# Patient Record
Sex: Female | Born: 1945 | Race: Black or African American | Hispanic: No | Smoking: Never smoker
Health system: Southern US, Community
[De-identification: ages and names within clinical notes are randomized; demographics above are authoritative.]

## PROBLEM LIST (undated history)

## (undated) DIAGNOSIS — M4800 Spinal stenosis, site unspecified: Secondary | ICD-10-CM

## (undated) DIAGNOSIS — Z973 Presence of spectacles and contact lenses: Secondary | ICD-10-CM

## (undated) DIAGNOSIS — T7840XA Allergy, unspecified, initial encounter: Secondary | ICD-10-CM

## (undated) DIAGNOSIS — K219 Gastro-esophageal reflux disease without esophagitis: Secondary | ICD-10-CM

## (undated) DIAGNOSIS — H269 Unspecified cataract: Secondary | ICD-10-CM

## (undated) DIAGNOSIS — Z972 Presence of dental prosthetic device (complete) (partial): Secondary | ICD-10-CM

## (undated) DIAGNOSIS — I10 Essential (primary) hypertension: Secondary | ICD-10-CM

## (undated) DIAGNOSIS — R7303 Prediabetes: Secondary | ICD-10-CM

## (undated) DIAGNOSIS — M1990 Unspecified osteoarthritis, unspecified site: Secondary | ICD-10-CM

## (undated) HISTORY — PX: CHOLECYSTECTOMY: SHX55

## (undated) HISTORY — DX: Essential (primary) hypertension: I10

## (undated) HISTORY — PX: SCHLEROTHERAPY: SHX5440

## (undated) HISTORY — DX: Gastro-esophageal reflux disease without esophagitis: K21.9

## (undated) HISTORY — DX: Unspecified cataract: H26.9

## (undated) HISTORY — DX: Prediabetes: R73.03

## (undated) HISTORY — DX: Allergy, unspecified, initial encounter: T78.40XA

## (undated) HISTORY — DX: Unspecified osteoarthritis, unspecified site: M19.90

## (undated) HISTORY — PX: ABDOMINAL HYSTERECTOMY: SHX81

---

## 1984-05-11 HISTORY — PX: BUNIONECTOMY: SHX129

## 1998-08-20 ENCOUNTER — Other Ambulatory Visit: Admission: RE | Admit: 1998-08-20 | Discharge: 1998-08-20 | Payer: Self-pay | Admitting: Internal Medicine

## 1999-02-17 ENCOUNTER — Other Ambulatory Visit: Admission: RE | Admit: 1999-02-17 | Discharge: 1999-02-17 | Payer: Self-pay | Admitting: Internal Medicine

## 1999-11-13 ENCOUNTER — Other Ambulatory Visit: Admission: RE | Admit: 1999-11-13 | Discharge: 1999-11-13 | Payer: Self-pay | Admitting: Family Medicine

## 2003-04-19 ENCOUNTER — Other Ambulatory Visit: Admission: RE | Admit: 2003-04-19 | Discharge: 2003-04-19 | Payer: Self-pay | Admitting: Obstetrics and Gynecology

## 2004-09-02 ENCOUNTER — Other Ambulatory Visit: Admission: RE | Admit: 2004-09-02 | Discharge: 2004-09-02 | Payer: Self-pay | Admitting: Obstetrics and Gynecology

## 2006-11-11 ENCOUNTER — Encounter: Admission: RE | Admit: 2006-11-11 | Discharge: 2006-11-11 | Payer: Self-pay | Admitting: Gastroenterology

## 2007-09-09 LAB — CONVERTED CEMR LAB: Pap Smear: NORMAL

## 2007-09-27 ENCOUNTER — Encounter: Admission: RE | Admit: 2007-09-27 | Discharge: 2007-09-27 | Payer: Self-pay | Admitting: Obstetrics and Gynecology

## 2007-10-14 ENCOUNTER — Ambulatory Visit: Payer: Self-pay | Admitting: Internal Medicine

## 2007-11-18 ENCOUNTER — Ambulatory Visit: Payer: Self-pay | Admitting: Internal Medicine

## 2007-11-18 LAB — CONVERTED CEMR LAB
ALT: 21 units/L (ref 0–35)
Basophils Absolute: 0 10*3/uL (ref 0.0–0.1)
Basophils Relative: 0.4 % (ref 0.0–1.0)
CO2: 31 meq/L (ref 19–32)
Calcium: 10 mg/dL (ref 8.4–10.5)
Cholesterol: 200 mg/dL (ref 0–200)
Creatinine, Ser: 0.6 mg/dL (ref 0.4–1.2)
Eosinophils Absolute: 0.1 10*3/uL (ref 0.0–0.7)
GFR calc Af Amer: 129 mL/min
GFR calc non Af Amer: 107 mL/min
HCT: 40.2 % (ref 36.0–46.0)
Hemoglobin: 13.2 g/dL (ref 12.0–15.0)
MCHC: 32.9 g/dL (ref 30.0–36.0)
MCV: 82.5 fL (ref 78.0–100.0)
Monocytes Absolute: 0.3 10*3/uL (ref 0.1–1.0)
Neutro Abs: 3.1 10*3/uL (ref 1.4–7.7)
RBC: 4.88 M/uL (ref 3.87–5.11)
TSH: 1.2 microintl units/mL (ref 0.35–5.50)
Total Bilirubin: 0.5 mg/dL (ref 0.3–1.2)
Triglycerides: 101 mg/dL (ref 0–149)

## 2007-11-25 ENCOUNTER — Ambulatory Visit: Payer: Self-pay | Admitting: Internal Medicine

## 2009-09-05 ENCOUNTER — Emergency Department (HOSPITAL_COMMUNITY): Admission: EM | Admit: 2009-09-05 | Discharge: 2009-09-05 | Payer: Self-pay | Admitting: Family Medicine

## 2010-03-28 ENCOUNTER — Encounter: Admission: RE | Admit: 2010-03-28 | Discharge: 2010-03-28 | Payer: Self-pay | Admitting: Family Medicine

## 2010-06-01 ENCOUNTER — Encounter: Payer: Self-pay | Admitting: Obstetrics and Gynecology

## 2010-06-02 ENCOUNTER — Encounter: Payer: Self-pay | Admitting: Internal Medicine

## 2010-07-29 LAB — POCT URINALYSIS DIP (DEVICE)
Ketones, ur: NEGATIVE mg/dL
Protein, ur: NEGATIVE mg/dL
Urobilinogen, UA: 0.2 mg/dL (ref 0.0–1.0)

## 2010-07-29 LAB — GC/CHLAMYDIA PROBE AMP, GENITAL: GC Probe Amp, Genital: NEGATIVE

## 2010-07-29 LAB — WET PREP, GENITAL: Trich, Wet Prep: NONE SEEN

## 2011-03-27 ENCOUNTER — Other Ambulatory Visit: Payer: Self-pay | Admitting: Family Medicine

## 2011-03-27 DIAGNOSIS — Z1231 Encounter for screening mammogram for malignant neoplasm of breast: Secondary | ICD-10-CM

## 2011-04-06 ENCOUNTER — Ambulatory Visit: Payer: Self-pay

## 2011-05-07 ENCOUNTER — Encounter (INDEPENDENT_AMBULATORY_CARE_PROVIDER_SITE_OTHER): Payer: Medicare Other | Admitting: Family Medicine

## 2011-05-07 DIAGNOSIS — I8390 Asymptomatic varicose veins of unspecified lower extremity: Secondary | ICD-10-CM

## 2011-05-07 DIAGNOSIS — Z0000 Encounter for general adult medical examination without abnormal findings: Secondary | ICD-10-CM

## 2011-05-07 DIAGNOSIS — I10 Essential (primary) hypertension: Secondary | ICD-10-CM

## 2011-06-16 ENCOUNTER — Ambulatory Visit
Admission: RE | Admit: 2011-06-16 | Discharge: 2011-06-16 | Disposition: A | Discharge status: Home / Self Care | Payer: Medicare Other | Source: Ambulatory Visit | Attending: Family Medicine | Admitting: Family Medicine

## 2011-06-16 DIAGNOSIS — Z1231 Encounter for screening mammogram for malignant neoplasm of breast: Secondary | ICD-10-CM

## 2011-07-23 ENCOUNTER — Encounter: Payer: Self-pay | Admitting: Gastroenterology

## 2011-10-22 ENCOUNTER — Other Ambulatory Visit: Payer: Self-pay | Admitting: Family Medicine

## 2011-11-24 ENCOUNTER — Other Ambulatory Visit: Payer: Self-pay | Admitting: Physician Assistant

## 2012-01-04 ENCOUNTER — Other Ambulatory Visit: Payer: Self-pay | Admitting: Physician Assistant

## 2012-02-11 ENCOUNTER — Other Ambulatory Visit: Payer: Self-pay | Admitting: Physician Assistant

## 2012-02-11 NOTE — Telephone Encounter (Signed)
Chart pulled to PA pool at nurse station [phone]

## 2012-05-19 ENCOUNTER — Encounter: Payer: Self-pay | Admitting: Family Medicine

## 2012-05-19 ENCOUNTER — Ambulatory Visit (INDEPENDENT_AMBULATORY_CARE_PROVIDER_SITE_OTHER): Payer: Medicare Other | Admitting: Family Medicine

## 2012-05-19 VITALS — BP 160/88 | HR 74 | Temp 97.7°F | Resp 16 | Ht 64.25 in | Wt 204.6 lb

## 2012-05-19 DIAGNOSIS — Z0000 Encounter for general adult medical examination without abnormal findings: Secondary | ICD-10-CM

## 2012-05-19 DIAGNOSIS — M1990 Unspecified osteoarthritis, unspecified site: Secondary | ICD-10-CM | POA: Insufficient documentation

## 2012-05-19 DIAGNOSIS — I10 Essential (primary) hypertension: Secondary | ICD-10-CM

## 2012-05-19 DIAGNOSIS — Z130 Encounter for screening for diseases of the blood and blood-forming organs and certain disorders involving the immune mechanism: Secondary | ICD-10-CM

## 2012-05-19 DIAGNOSIS — Z1329 Encounter for screening for other suspected endocrine disorder: Secondary | ICD-10-CM

## 2012-05-19 DIAGNOSIS — M6530 Trigger finger, unspecified finger: Secondary | ICD-10-CM

## 2012-05-19 DIAGNOSIS — I868 Varicose veins of other specified sites: Secondary | ICD-10-CM

## 2012-05-19 DIAGNOSIS — E559 Vitamin D deficiency, unspecified: Secondary | ICD-10-CM

## 2012-05-19 LAB — POCT URINALYSIS DIPSTICK
Bilirubin, UA: NEGATIVE
Blood, UA: NEGATIVE
Glucose, UA: NEGATIVE
Ketones, UA: NEGATIVE
Leukocytes, UA: NEGATIVE
Nitrite, UA: NEGATIVE
Protein, UA: NEGATIVE
Spec Grav, UA: >=1.03
Urobilinogen, UA: 0.2
pH, UA: 5.5

## 2012-05-19 LAB — CBC WITH DIFFERENTIAL/PLATELET
Basophils Absolute: 0 10*3/uL (ref 0.0–0.1)
Basophils Relative: 0 % (ref 0–1)
Eosinophils Absolute: 0.1 10*3/uL (ref 0.0–0.7)
Eosinophils Relative: 1 % (ref 0–5)
HCT: 42.4 % (ref 36.0–46.0)
Hemoglobin: 14.1 g/dL (ref 12.0–15.0)
Lymphocytes Relative: 33 % (ref 12–46)
Lymphs Abs: 2.2 10*3/uL (ref 0.7–4.0)
MCH: 26.4 pg (ref 26.0–34.0)
MCHC: 33.3 g/dL (ref 30.0–36.0)
MCV: 79.3 fL (ref 78.0–100.0)
Monocytes Absolute: 0.3 10*3/uL (ref 0.1–1.0)
Monocytes Relative: 5 % (ref 3–12)
Neutro Abs: 4 10*3/uL (ref 1.7–7.7)
Neutrophils Relative %: 61 % (ref 43–77)
Platelets: 234 10*3/uL (ref 150–400)
RBC: 5.35 MIL/uL — ABNORMAL HIGH (ref 3.87–5.11)
RDW: 14.6 % (ref 11.5–15.5)
WBC: 6.6 10*3/uL (ref 4.0–10.5)

## 2012-05-19 LAB — COMPREHENSIVE METABOLIC PANEL
ALT: 18 U/L (ref 0–35)
AST: 17 U/L (ref 0–37)
Albumin: 4.3 g/dL (ref 3.5–5.2)
Alkaline Phosphatase: 82 U/L (ref 39–117)
BUN: 14 mg/dL (ref 6–23)
CO2: 28 mEq/L (ref 19–32)
Calcium: 11.4 mg/dL — ABNORMAL HIGH (ref 8.4–10.5)
Chloride: 101 mEq/L (ref 96–112)
Creat: 0.8 mg/dL (ref 0.50–1.10)
Glucose, Bld: 96 mg/dL (ref 70–99)
Potassium: 4.5 mEq/L (ref 3.5–5.3)
Sodium: 134 mEq/L — ABNORMAL LOW (ref 135–145)
Total Bilirubin: 0.5 mg/dL (ref 0.3–1.2)
Total Protein: 7.3 g/dL (ref 6.0–8.3)

## 2012-05-19 LAB — LIPID PANEL
Cholesterol: 214 mg/dL — ABNORMAL HIGH (ref 0–200)
HDL: 43 mg/dL (ref >39)
LDL Cholesterol: 150 mg/dL — ABNORMAL HIGH (ref 0–99)
Total CHOL/HDL Ratio: 5 Ratio
Triglycerides: 105 mg/dL (ref <150)
VLDL: 21 mg/dL (ref 0–40)

## 2012-05-19 LAB — VITAMIN D 25 HYDROXY (VIT D DEFICIENCY, FRACTURES): Vit D, 25-Hydroxy: 23 ng/mL — ABNORMAL LOW (ref 30–89)

## 2012-05-19 LAB — TSH: TSH: 1.592 u[IU]/mL (ref 0.350–4.500)

## 2012-05-19 MED ORDER — HYDROCHLOROTHIAZIDE 12.5 MG PO CAPS: 12.5000 mg | ORAL_CAPSULE | Freq: Every day | ORAL | Status: DC

## 2012-05-19 MED ORDER — DICLOFENAC SODIUM 1 % TD GEL: 4.0000 g | Freq: Four times a day (QID) | TRANSDERMAL | Status: DC

## 2012-05-19 NOTE — Patient Instructions (Signed)

## 2012-05-19 NOTE — Progress Notes (Signed)
Patient ID: Yvonne Mahoney MRN: [id number], DOB: May 20, 1945, 67 y.o. Date of Encounter: 05/19/2012, 9:06 AM  Primary Physician: Judie Petit, MD  Chief Complaint: Physical (CPE)  HPI: 67 y.o. y/o female with history of noted below here for CPE.  Doing well. Retired Tree surgeon reserves.  Divorced.  Son lives in [place], parents deceased Patient walks 1.7 to over 2 miles daily.  Never smoked or drank.  Colonoscopy 10/02/10 Eye exam yearly:  Cataracts reported Pneumonia vaccine:  10/01/01,  Tetanus:  01-Oct-2008,  Shingles vaccine prescribed in 10/01/08 but patient could not afford Patient has no record of having had chicken pox:  Parents and siblings are all dead.  LMP: years ago Pap: 2008-10-01 MMG: 2014 Review of Systems: Consitutional: No fever, chills, fatigue, night sweats, lymphadenopathy, or weight changes. Eyes: No visual changes, eye redness, or discharge. ENT/Mouth: Ears: No otalgia, tinnitus, hearing loss, discharge. Nose: No congestion, rhinorrhea, sinus pain, or epistaxis. Throat: No sore throat, post nasal drip, or teeth pain. Cardiovascular: No CP, palpitations, diaphoresis, DOE, edema, orthopnea, PND. Respiratory: No cough, hemoptysis, SOB, or wheezing. Gastrointestinal: No anorexia, dysphagia, reflux, pain, nausea, vomiting, hematemesis, diarrhea, constipation, BRBPR, or melena. Breast: No discharge, pain, swelling, or mass. Genitourinary: No dysuria, frequency, urgency, hematuria, incontinence, nocturia, amenorrhea, vaginal discharge, pruritis, burning, abnormal bleeding, or pain. Musculoskeletal: No decreased ROM, myalgias, stiffness, joint swelling, or weakness. Skin: No rash, erythema, lesion changes, pain, warmth, jaundice, or pruritis. Neurological: No headache, dizziness, syncope, seizures, tremors, memory loss, coordination problems, or paresthesias. Psychological: No anxiety, depression, hallucinations, SI/HI. Endocrine: No fatigue, polydipsia, polyphagia,  polyuria, or known diabetes. All other systems were reviewed and are otherwise negative.  Past Medical History  Diagnosis Date  . Allergy   . Hypertension   . Arthritis   . Cataract      Past Surgical History  Procedure Date  . Cholecystectomy   . Abdominal hysterectomy   . Bunionectomy     Home Meds:  Prior to Admission medications   Medication Sig Start Date End Date Taking? Authorizing Provider  Vitamin D, Ergocalciferol, (DRISDOL) 50000 UNITS CAPS TAKE ONE CAPSULE BY MOUTH ONCE A WEEK --  **NEEDS  OFFICE  VISIT  FOR  MORE  REFILLS** 02/11/12  Yes Sondra Barges, PA-C    Allergies:  Allergies  Allergen Reactions  . Penicillins     REACTION: allergy/hand swelling    History   Social History  . Marital Status: Divorced    Spouse Name: N/A    Number of Children: N/A  . Years of Education: N/A   Occupational History  . Not on file.   Social History Main Topics  . Smoking status: Never Smoker   . Smokeless tobacco: Not on file  . Alcohol Use: No  . Drug Use: Not on file  . Sexually Active: Not on file   Other Topics Concern  . Not on file   Social History Narrative  . No narrative on file    No family history on file.  Physical Exam:  BP recheck 134/72 Blood pressure 160/88, pulse 74, temperature 97.7 F (36.5 C), temperature source Oral, resp. rate 16, height 5' 4.25" (1.632 m), weight 204 lb 9.6 oz (92.806 kg), SpO2 98.00%., Body mass index is 34.85 kg/(m^2). General: Well developed, well nourished, in no acute distress. HEENT: Normocephalic, atraumatic. Conjunctiva pink, sclera non-icteric. Pupils 2 mm constricting to 1 mm, round, regular, and equally reactive to light and accomodation. EOMI.  Bilateral small cataracts. Internal auditory  canal clear. TMs with good cone of light and without pathology. Nasal mucosa pink. Nares are without discharge. No sinus tenderness. Oral mucosa pink. Dentition good. Pharynx without exudate.   Neck: Supple. Trachea  midline. No thyromegaly. Full ROM. No lymphadenopathy. Lungs: Clear to auscultation bilaterally without wheezes, rales, or rhonchi. Breathing is of normal effort and unlabored. Cardiovascular: RRR with S1 S2. No murmurs, rubs, or gallops appreciated. Distal pulses 2+ symmetrically. No carotid or abdominal bruits. Breast: Symmetrical. No masses. Nipples without discharge. Abdomen: Soft, non-tender, non-distended with normoactive bowel sounds. No hepatosplenomegaly or masses. No rebound/guarding. No CVA tenderness. Without hernias.  Musculoskeletal: Full range of motion and 5/5 strength throughout. Without swelling, atrophy, tenderness, crepitus, or warmth. Extremities without clubbing, cyanosis, or edema.  Patient has 1/2 cm swelling at insertion of achilles tendons bilaterally.   Calves supple.  Patient has trigger finger of left thumb and has chronic swelling of DIP of each of her thumbs where she suffered fractures in the past. Skin: Warm and moist without erythema, ecchymosis, wounds, or rash.  Patient has spider veins of both legs Neuro: A+Ox3. CN II-XII grossly intact. Moves all extremities spontaneously. Full sensation throughout. Normal gait. DTR 2+ throughout upper and lower extremities. Finger to nose intact. Psych:  Responds to questions appropriately with a normal affect.   Studies: CBC, CMET, Lipid, TSH are pending.  Medicare won't pay for the varicella titer UA:  Dip only  Assessment/Plan:  High functioning 67 yo retired woman who is active in church, walks regularly, and is up to date on immunizations and preventive care issues.  67 y.o. y/o female here for CPE Referral for trigger finger left thumb Referral for troublesome spider veins of both legs -  Signed, [name], MD 05/19/2012 9:06 AM

## 2012-05-19 NOTE — Addendum Note (Signed)
Addended by: [name] on: 05/19/2012 11:35 AM   Modules accepted: Orders

## 2012-05-20 ENCOUNTER — Other Ambulatory Visit: Payer: Self-pay | Admitting: Physician Assistant

## 2012-05-20 ENCOUNTER — Telehealth: Payer: Self-pay

## 2012-05-20 LAB — VARICELLA ZOSTER ANTIBODY, IGG: Varicella IgG: 4.87 {ISR} — ABNORMAL HIGH (ref <0.90)

## 2012-05-20 MED ORDER — VITAMIN D (ERGOCALCIFEROL) 1.25 MG (50000 UNIT) PO CAPS: 50000.0000 [IU] | ORAL_CAPSULE | ORAL | Status: DC

## 2012-05-20 NOTE — Telephone Encounter (Signed)
Do you want her to continue the Vitamin D 50,000 units weekly? Please advise, I can send in.

## 2012-05-20 NOTE — Telephone Encounter (Signed)
Sent in Rx to [facility] and tried to notify pt done and that she needs to RTC for re-check in 6 mos, but no VM set up on either H or cell #s. Left call back # for pager.

## 2012-05-20 NOTE — Telephone Encounter (Signed)
Patient had CPE yesterday and Vitamin D rx was not sent in to pharmacy. She would like [facility] [place] [facility].

## 2012-05-20 NOTE — Telephone Encounter (Signed)
Yes, please continue x 6 months and then recheck lab

## 2012-05-22 NOTE — Telephone Encounter (Signed)
Both mailboxes are full.  

## 2012-05-23 ENCOUNTER — Other Ambulatory Visit: Payer: Self-pay | Admitting: *Deleted

## 2012-05-23 MED ORDER — ZOSTER VACCINE LIVE 19400 UNT/0.65ML ~~LOC~~ SOLR: 0.6500 mL | Freq: Once | SUBCUTANEOUS | Status: DC

## 2012-06-16 ENCOUNTER — Other Ambulatory Visit: Payer: Self-pay | Admitting: *Deleted

## 2012-06-16 MED ORDER — VITAMIN D (ERGOCALCIFEROL) 1.25 MG (50000 UNIT) PO CAPS: 50000.0000 [IU] | ORAL_CAPSULE | ORAL | Status: DC

## 2012-12-15 ENCOUNTER — Other Ambulatory Visit: Payer: Self-pay | Admitting: Family Medicine

## 2013-01-18 ENCOUNTER — Other Ambulatory Visit: Payer: Self-pay | Admitting: Physician Assistant

## 2013-04-18 ENCOUNTER — Other Ambulatory Visit: Payer: Self-pay

## 2013-04-18 DIAGNOSIS — Z1231 Encounter for screening mammogram for malignant neoplasm of breast: Secondary | ICD-10-CM

## 2013-05-17 ENCOUNTER — Other Ambulatory Visit: Payer: Self-pay | Admitting: Family Medicine

## 2013-05-25 ENCOUNTER — Encounter: Payer: Self-pay | Admitting: Family Medicine

## 2013-05-25 ENCOUNTER — Ambulatory Visit
Admission: RE | Admit: 2013-05-25 | Discharge: 2013-05-25 | Disposition: A | Discharge status: Home / Self Care | Payer: Medicare Other | Source: Ambulatory Visit

## 2013-05-25 ENCOUNTER — Ambulatory Visit: Payer: Medicare Other

## 2013-05-25 ENCOUNTER — Ambulatory Visit (INDEPENDENT_AMBULATORY_CARE_PROVIDER_SITE_OTHER): Payer: Medicare Other | Admitting: Family Medicine

## 2013-05-25 VITALS — BP 140/90 | HR 80 | Temp 98.1°F | Resp 16 | Ht 64.0 in | Wt 210.2 lb

## 2013-05-25 DIAGNOSIS — Z0000 Encounter for general adult medical examination without abnormal findings: Secondary | ICD-10-CM

## 2013-05-25 DIAGNOSIS — E559 Vitamin D deficiency, unspecified: Secondary | ICD-10-CM

## 2013-05-25 DIAGNOSIS — I10 Essential (primary) hypertension: Secondary | ICD-10-CM

## 2013-05-25 DIAGNOSIS — Z1231 Encounter for screening mammogram for malignant neoplasm of breast: Secondary | ICD-10-CM

## 2013-05-25 LAB — COMPREHENSIVE METABOLIC PANEL
ALT: 19 U/L (ref 0–35)
AST: 19 U/L (ref 0–37)
Albumin: 3.8 g/dL (ref 3.5–5.2)
Alkaline Phosphatase: 73 U/L (ref 39–117)
BUN: 13 mg/dL (ref 6–23)
CO2: 28 mEq/L (ref 19–32)
Calcium: 10.9 mg/dL — ABNORMAL HIGH (ref 8.4–10.5)
Chloride: 100 mEq/L (ref 96–112)
Creat: 0.71 mg/dL (ref 0.50–1.10)
Glucose, Bld: 98 mg/dL (ref 70–99)
Potassium: 4.5 mEq/L (ref 3.5–5.3)
Sodium: 136 mEq/L (ref 135–145)
Total Bilirubin: 0.4 mg/dL (ref 0.3–1.2)
Total Protein: 7.1 g/dL (ref 6.0–8.3)

## 2013-05-25 LAB — CBC
HCT: 40.8 % (ref 36.0–46.0)
Hemoglobin: 13.6 g/dL (ref 12.0–15.0)
MCH: 26.8 pg (ref 26.0–34.0)
MCHC: 33.3 g/dL (ref 30.0–36.0)
MCV: 80.5 fL (ref 78.0–100.0)
Platelets: 224 10*3/uL (ref 150–400)
RBC: 5.07 MIL/uL (ref 3.87–5.11)
RDW: 14.9 % (ref 11.5–15.5)
WBC: 6 10*3/uL (ref 4.0–10.5)

## 2013-05-25 LAB — LIPID PANEL
Cholesterol: 232 mg/dL — ABNORMAL HIGH (ref 0–200)
HDL: 39 mg/dL — ABNORMAL LOW (ref >39)
LDL Cholesterol: 145 mg/dL — ABNORMAL HIGH (ref 0–99)
Total CHOL/HDL Ratio: 5.9 Ratio
Triglycerides: 242 mg/dL — ABNORMAL HIGH (ref <150)
VLDL: 48 mg/dL — ABNORMAL HIGH (ref 0–40)

## 2013-05-25 LAB — TSH: TSH: 1.713 u[IU]/mL (ref 0.350–4.500)

## 2013-05-25 MED ORDER — VITAMIN D (ERGOCALCIFEROL) 1.25 MG (50000 UNIT) PO CAPS: 50000.0000 [IU] | ORAL_CAPSULE | ORAL | Status: DC

## 2013-05-25 MED ORDER — HYDROCHLOROTHIAZIDE 12.5 MG PO CAPS: 12.5000 mg | ORAL_CAPSULE | Freq: Every day | ORAL | Status: DC

## 2013-05-25 NOTE — Patient Instructions (Signed)

## 2013-05-25 NOTE — Progress Notes (Signed)
   Subjective:    Patient ID: Yvonne Mahoney, female    DOB: [birth year], 68 y.o.   MRN: [id number]  HPI 68 yo divorced woman with separated son living in [place], [place].  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  Hasn't been walking because of the weather.  Had spider veins treated. Plays solitaire every morning.  No increase in problems with fingers.  Patient is concerned about HPV.  She had relations with man diagnosed with HPV years ago.  S/P bunionectomies S/P vein surgeries F/H alzheimers mother and sisters   Review of Systems  Constitutional: Positive for diaphoresis and fatigue.  HENT: Positive for dental problem, facial swelling, sinus pressure and sneezing.   Eyes: Positive for visual disturbance.  Respiratory: Positive for choking.   Cardiovascular: Positive for leg swelling.  Gastrointestinal: Negative.   Endocrine: Negative.   Genitourinary: Negative.   Musculoskeletal: Negative.   Skin: Negative.   Allergic/Immunologic: Positive for environmental allergies.  Neurological: Positive for facial asymmetry and numbness.  Hematological: Negative.   Psychiatric/Behavioral: Positive for sleep disturbance.   Occasional numbness in fingers.  Some facial swelling recently in context of sinus infection.  This has resolved The hctz tends to make her void more (she takes it at night, discussed with her that should be taken in am)    Objective:   Physical Exam NAD TM's normal Eyes:  Early cataracts Oroph:  Clear Neck:  Supple, no thyromegaly, no adenopathy Chest:  Clear Heart:  Reg, no murmur Abdomen:  Soft, no HSM Skin:  Small tag behind left ear Ext:  1+ edema, good pulses. Neuro: no focal weakness, stable gait Breasts:  normal Pelvic:  Normal ext genitalia, S/P hysterectomy, normal vagina    Assessment & Plan:  Annual physical exam - Plan: CBC, Comprehensive metabolic panel, Lipid panel, TSH, Vit D  25 hydroxy (rtn osteoporosis monitoring), POCT  urinalysis dipstick, Pap IG w/ reflex to HPV when ASC-U Overall, patient seems to be doing well. Recent cold resulted in several new symptoms but they are resolving. She sees a Armed forces operational officerdental hygienist routinely and has yearly exams on her eyes. It was get the weight off that she is more active once the weather clears. Signed, Elvina SidleKurt Odis Wickey, MD

## 2013-05-26 LAB — PAP IG W/ RFLX HPV ASCU

## 2013-05-26 LAB — VITAMIN D 25 HYDROXY (VIT D DEFICIENCY, FRACTURES): Vit D, 25-Hydroxy: 28 ng/mL — ABNORMAL LOW (ref 30–89)

## 2013-05-29 ENCOUNTER — Telehealth: Payer: Self-pay

## 2013-05-29 NOTE — Telephone Encounter (Signed)
Message copied by Johnnette LitterARDWELL, Olamide Lahaie M on Mon May 29, 2013  2:26 PM ------      Message from: Elvina SidleLAUENSTEIN, KURT      Created: Mon May 29, 2013  2:09 PM       Please inform patient of normal result ------

## 2013-05-29 NOTE — Telephone Encounter (Signed)
Patient notified and is on 50000IU prescription 1 time a week. She wants to know if she needs to continue this as well with the RX or stop that and just take the OTC vitamin.

## 2013-07-03 ENCOUNTER — Other Ambulatory Visit: Payer: Self-pay | Admitting: Family Medicine

## 2013-07-12 ENCOUNTER — Other Ambulatory Visit: Payer: Self-pay | Admitting: Family Medicine

## 2013-10-21 ENCOUNTER — Ambulatory Visit (INDEPENDENT_AMBULATORY_CARE_PROVIDER_SITE_OTHER): Payer: Medicare Other | Admitting: Emergency Medicine

## 2013-10-21 VITALS — BP 120/74 | HR 91 | Temp 97.7°F | Resp 18 | Ht 64.25 in | Wt 207.6 lb

## 2013-10-21 DIAGNOSIS — H1045 Other chronic allergic conjunctivitis: Secondary | ICD-10-CM

## 2013-10-21 DIAGNOSIS — H1010 Acute atopic conjunctivitis, unspecified eye: Secondary | ICD-10-CM

## 2013-10-21 DIAGNOSIS — J309 Allergic rhinitis, unspecified: Secondary | ICD-10-CM

## 2013-10-21 MED ORDER — FEXOFENADINE HCL 180 MG PO TABS: 180.0000 mg | ORAL_TABLET | Freq: Every day | ORAL | Status: DC

## 2013-10-21 MED ORDER — PROMETHAZINE-CODEINE 6.25-10 MG/5ML PO SYRP: 5.0000 mL | ORAL_SOLUTION | Freq: Four times a day (QID) | ORAL | Status: DC | PRN

## 2013-10-21 MED ORDER — OLOPATADINE HCL 0.1 % OP SOLN: 1.0000 [drp] | Freq: Two times a day (BID) | OPHTHALMIC | Status: DC

## 2013-10-21 NOTE — Progress Notes (Signed)
[facility] [address], [place] [address] [phone]  Date:  10/21/2013   Name:  Yvonne Mahoney   DOB:  07-31-1945   MRN:  [id number]  PCP:  Judie PetitSWORDS,BRUCE HENRY, MD    Chief Complaint: Cough   History of Present Illness:  Yvonne Mahoney is a [age] y.o. very pleasant female patient who presents with the following:  One month history of being "strangled" with mucous, clear watery drainage from nose.  No post nasal drip.  Has non productive cough, watery eyes.  No fever or chills. No nausea or vomiting. No stool change or rash.  Seen twice and had negative CXR, zpak and steroid injection. No improvement with flonase, benadryl and tessalon.  No wheezing or shortness of breath.  No improvement with over the counter medications or other home remedies. Denies other complaint or health concern today.   Patient Active Problem List   Diagnosis Date Noted  . Hypertension 05/19/2012  . Osteoarthritis 05/19/2012    Past Medical History  Diagnosis Date  . Allergy   . Hypertension   . Arthritis   . Cataract     Past Surgical History  Procedure Laterality Date  . Cholecystectomy    . Abdominal hysterectomy    . Bunionectomy  1986    both    History  Substance Use Topics  . Smoking status: Never Smoker   . Smokeless tobacco: Not on file  . Alcohol Use: No    Family History  Problem Relation Age of Onset  . Dementia Mother   . Hypertension Mother   . Cancer Father   . Cancer Sister   . Hyperlipidemia Sister   . Hypertension Sister     Allergies  Allergen Reactions  . Penicillins     REACTION: allergy/hand swelling and itching    Medication list has been reviewed and updated.  Current Outpatient Prescriptions on File Prior to Visit  Medication Sig Dispense Refill  . diclofenac sodium (VOLTAREN) 1 % GEL Apply 4 g topically 4 (four) times daily.  100 g  1  . hydrochlorothiazide (MICROZIDE) 12.5 MG capsule Take 1 capsule (12.5 mg total)  by mouth daily.  90 capsule  3  . hydrochlorothiazide (MICROZIDE) 12.5 MG capsule TAKE ONE CAPSULE BY MOUTH EVERY DAY  90 capsule  3  . Vitamin D, Ergocalciferol, (DRISDOL) 50000 UNITS CAPS capsule TAKE ONE CAPSULE BY MOUTH WEEKLY.  4 capsule  10  . zoster vaccine live, PF, (ZOSTAVAX) [id number] UNT/0.65ML injection Inject 19,400 Units into the skin once.  1 each  0   No current facility-administered medications on file prior to visit.    Review of Systems:  As per HPI, otherwise negative.    Physical Examination: Filed Vitals:   10/21/13 0833  BP: 120/74  Pulse: 91  Temp: 97.7 F (36.5 C)  Resp: 18   Filed Vitals:   10/21/13 0833  Height: 5' 4.25" (1.632 m)  Weight: 207 lb 9.6 oz (94.167 kg)   Body mass index is 35.36 kg/(m^2). Ideal Body Weight: Weight in (lb) to have BMI = 25: 146.5  GEN: WDWN, NAD, Non-toxic, A & O x 3 HEENT: Atraumatic, Normocephalic. Neck supple. No masses, No LAD.  Eyelids swollen.   Ears and Nose: No external deformity. CV: RRR, No M/G/R. No JVD. No thrill. No extra heart sounds. PULM: CTA B, no wheezes, crackles, rhonchi. No retractions. No resp. distress. No accessory muscle use. ABD: S, NT, ND, +BS. No rebound.  No HSM. EXTR: No c/c/e NEURO Normal gait.  PSYCH: Normally interactive. Conversant. Not depressed or anxious appearing.  Calm demeanor.    Assessment and Plan: SAR patanol Allegra  Signed,  Phillips OdorJeffery Anderson, MD

## 2013-10-21 NOTE — Patient Instructions (Signed)
Allergic Conjunctivitis The conjunctiva is a thin membrane that covers the visible white part of the eyeball and the underside of the eyelids. This membrane protects and lubricates the eye. The membrane has small blood vessels running through it that can normally be seen. When the conjunctiva becomes inflamed, the condition is called conjunctivitis. In response to the inflammation, the conjunctival blood vessels become swollen. The swelling results in redness in the normally white part of the eye. The blood vessels of this membrane also react when a person has allergies and is then called allergic conjunctivitis. This condition usually lasts for as long as the allergy persists. Allergic conjunctivitis cannot be passed to another person (non-contagious). The likelihood of bacterial infection is great and the cause is not likely due to allergies if the inflamed eye has:  A sticky discharge.  Discharge or sticking together of the lids in the morning.  Scaling or flaking of the eyelids where the eyelashes come out.  Red swollen eyelids. CAUSES   Viruses.  Irritants such as foreign bodies.  Chemicals.  General allergic reactions.  Inflammation or serious diseases in the inside or the outside of the eye or the orbit (the boney cavity in which the eye sits) can cause a "red eye." SYMPTOMS   Eye redness.  Tearing.  Itchy eyes.  Burning feeling in the eyes.  Clear drainage from the eye.  Allergic reaction due to pollens or ragweed sensitivity. Seasonal allergic conjunctivitis is frequent in the spring when pollens are in the air and in the fall. DIAGNOSIS  This condition, in its many forms, is usually diagnosed based on the history and an ophthalmological exam. It usually involves both eyes. If your eyes react at the same time every year, allergies may be the cause. While most "red eyes" are due to allergy or an infection, the role of an eye (ophthalmological) exam is important. The exam  can rule out serious diseases of the eye or orbit. TREATMENT   Non-antibiotic eye drops, ointments, or medications by mouth may be prescribed if the ophthalmologist is sure the conjunctivitis is due to allergies alone.  Over-the-counter drops and ointments for allergic symptoms should be used only after other causes of conjunctivitis have been ruled out, or as your caregiver suggests. Medications by mouth are often prescribed if other allergy-related symptoms are present. If the ophthalmologist is sure that the conjunctivitis is due to allergies alone, treatment is normally limited to drops or ointments to reduce itching and burning. HOME CARE INSTRUCTIONS   Wash hands before and after applying drops or ointments, or touching the inflamed eye(s) or eyelids.  Do not let the eye dropper tip or ointment tube touch the eyelid when putting medicine in your eye.  Stop using your soft contact lenses and throw them away. Use a new pair of lenses when recovery is complete. You should run through sterilizing cycles at least three times before use after complete recovery if the old soft contact lenses are to be used. Hard contact lenses should be stopped. They need to be thoroughly sterilized before use after recovery.  Itching and burning eyes due to allergies is often relieved by using a cool cloth applied to closed eye(s). SEEK MEDICAL CARE IF:   Your problems do not go away after two or three days of treatment.  Your lids are sticky (especially in the morning when you wake up) or stick together.  Discharge develops. Antibiotics may be needed either as drops, ointment, or by mouth.  You   have extreme light sensitivity.  An oral temperature above 102 F (38.9 C) develops.  Pain in or around the eye or any other visual symptom develops. MAKE SURE YOU:   Understand these instructions.  Will watch your condition.  Will get help right away if you are not doing well or get worse. Document  Released: 07/18/2002 Document Revised: 07/20/2011 Document Reviewed: 06/13/2007 [facility] Patient Information 2014 [place], [place]. Allergic Rhinitis Allergic rhinitis is when the mucous membranes in the nose respond to allergens. Allergens are particles in the air that cause your body to have an allergic reaction. This causes you to release allergic antibodies. Through a chain of events, these eventually cause you to release histamine into the blood stream. Although meant to protect the body, it is this release of histamine that causes your discomfort, such as frequent sneezing, congestion, and an itchy, runny nose.  CAUSES  Seasonal allergic rhinitis (hay fever) is caused by pollen allergens that may come from grasses, trees, and weeds. Year-round allergic rhinitis (perennial allergic rhinitis) is caused by allergens such as house dust mites, pet dander, and mold spores.  SYMPTOMS   Nasal stuffiness (congestion).  Itchy, runny nose with sneezing and tearing of the eyes. DIAGNOSIS  Your health care provider can help you determine the allergen or allergens that trigger your symptoms. If you and your health care provider are unable to determine the allergen, skin or blood testing may be used. TREATMENT  Allergic Rhinitis does not have a cure, but it can be controlled by:  Medicines and allergy shots (immunotherapy).  Avoiding the allergen. Hay fever may often be treated with antihistamines in pill or nasal spray forms. Antihistamines block the effects of histamine. There are over-the-counter medicines that may help with nasal congestion and swelling around the eyes. Check with your health care provider before taking or giving this medicine.  If avoiding the allergen or the medicine prescribed do not work, there are many new medicines your health care provider can prescribe. Stronger medicine may be used if initial measures are ineffective. Desensitizing injections can be used if medicine and  avoidance does not work. Desensitization is when a patient is given ongoing shots until the body becomes less sensitive to the allergen. Make sure you follow up with your health care provider if problems continue. HOME CARE INSTRUCTIONS It is not possible to completely avoid allergens, but you can reduce your symptoms by taking steps to limit your exposure to them. It helps to know exactly what you are allergic to so that you can avoid your specific triggers. SEEK MEDICAL CARE IF:   You have a fever.  You develop a cough that does not stop easily (persistent).  You have shortness of breath.  You start wheezing.  Symptoms interfere with normal daily activities. Document Released: 01/20/2001 Document Revised: 02/15/2013 Document Reviewed: 01/02/2013 [facility] Patient Information 2014 [place], [place].

## 2014-04-08 ENCOUNTER — Encounter (HOSPITAL_COMMUNITY): Payer: Self-pay | Admitting: *Deleted

## 2014-04-08 ENCOUNTER — Emergency Department (INDEPENDENT_AMBULATORY_CARE_PROVIDER_SITE_OTHER)
Admission: EM | Admit: 2014-04-08 | Discharge: 2014-04-08 | Disposition: A | Discharge status: Home / Self Care | Payer: Medicare Other | Source: Home / Self Care | Attending: Family Medicine | Admitting: Family Medicine

## 2014-04-08 DIAGNOSIS — K5901 Slow transit constipation: Secondary | ICD-10-CM

## 2014-04-08 LAB — POCT URINALYSIS DIP (DEVICE)
BILIRUBIN URINE: NEGATIVE
Glucose, UA: NEGATIVE mg/dL
HGB URINE DIPSTICK: NEGATIVE
KETONES UR: NEGATIVE mg/dL
Nitrite: NEGATIVE
PH: 5 (ref 5.0–8.0)
PROTEIN: NEGATIVE mg/dL
Specific Gravity, Urine: >=1.03 (ref 1.005–1.030)
Urobilinogen, UA: 0.2 mg/dL (ref 0.0–1.0)

## 2014-04-08 MED ORDER — POLYETHYLENE GLYCOL 3350 17 G PO PACK: 17.0000 g | PACK | Freq: Every day | ORAL | Status: DC

## 2014-04-08 NOTE — ED Provider Notes (Signed)
CSN: [id number]     Arrival date & time 04/08/14  1125 History   First MD Initiated Contact with Patient 04/08/14 1136     Chief Complaint  Patient presents with  . Constipation   (Consider location/radiation/quality/duration/timing/severity/associated sxs/prior Treatment) Patient is a 68 y.o. female presenting with constipation. The history is provided by the patient.  Constipation Severity:  Mild Time since last bowel movement:  4 days Progression:  Waxing and waning Chronicity:  Chronic Context: dehydration and dietary changes   Stool description:  Hard Ineffective treatments:  None tried Associated symptoms: abdominal pain   Associated symptoms: no back pain, no diarrhea, no dysuria, no fever, no nausea and no vomiting   Risk factors: travel     Past Medical History  Diagnosis Date  . Allergy   . Hypertension   . Arthritis   . Cataract    Past Surgical History  Procedure Laterality Date  . Cholecystectomy    . Abdominal hysterectomy    . Bunionectomy  1986    both   Family History  Problem Relation Age of Onset  . Dementia Mother   . Hypertension Mother   . Cancer Father   . Cancer Sister   . Hyperlipidemia Sister   . Hypertension Sister    History  Substance Use Topics  . Smoking status: Never Smoker   . Smokeless tobacco: Not on file  . Alcohol Use: No   OB History    No data available     Review of Systems  Constitutional: Negative.  Negative for fever.  Gastrointestinal: Positive for abdominal pain and constipation. Negative for nausea, vomiting, diarrhea and blood in stool.  Genitourinary: Positive for flank pain. Negative for dysuria.  Musculoskeletal: Negative for back pain and gait problem.  Skin: Negative.     Allergies  Penicillins  Home Medications   Prior to Admission medications   Medication Sig Start Date End Date Taking? Authorizing Provider  diclofenac sodium (VOLTAREN) 1 % GEL Apply 4 g topically 4 (four) times daily. 05/19/12    Elvina SidleKurt Lauenstein, MD  fexofenadine [facility](ALLEGRA ALLERGY) 180 MG tablet Take 1 tablet (180 mg total) by mouth daily. 10/21/13   Carmelina DaneJeffery S Anderson, MD  hydrochlorothiazide (MICROZIDE) 12.5 MG capsule Take 1 capsule (12.5 mg total) by mouth daily. 05/25/13   Elvina SidleKurt Lauenstein, MD  hydrochlorothiazide (MICROZIDE) 12.5 MG capsule TAKE ONE CAPSULE BY MOUTH EVERY DAY    Elvina SidleKurt Lauenstein, MD  olopatadine (PATANOL) 0.1 % ophthalmic solution Place 1 drop into both eyes 2 (two) times daily. 10/21/13   Carmelina DaneJeffery S Anderson, MD  polyethylene glycol (MIRALAX / Ethelene HalGLYCOLAX) packet Take 17 g by mouth daily. 04/08/14   Linna HoffJames D Micky Sheller, MD  promethazine-codeine (PHENERGAN WITH CODEINE) 6.25-10 MG/5ML syrup Take 5-10 mLs by mouth every 6 (six) hours as needed. 10/21/13   Carmelina DaneJeffery S Anderson, MD  Vitamin D, Ergocalciferol, (DRISDOL) 50000 UNITS CAPS capsule TAKE ONE CAPSULE BY MOUTH WEEKLY.    Elvina SidleKurt Lauenstein, MD  zoster vaccine live, PF, (ZOSTAVAX) [id number] UNT/0.65ML injection Inject 19,400 Units into the skin once. 05/23/12   Elvina SidleKurt Lauenstein, MD   BP 136/77 mmHg  Pulse 103  Temp(Src) 98.4 F (36.9 C) (Oral)  Resp 14  SpO2 96% Physical Exam  Constitutional: She is oriented to person, place, and time. She appears well-developed and well-nourished.  Cardiovascular: Normal heart sounds.   Pulmonary/Chest: Effort normal and breath sounds normal.  Abdominal: Soft. Bowel sounds are normal. She exhibits no distension and no mass. There is no  tenderness. There is no rebound and no guarding.  Neurological: She is alert and oriented to person, place, and time.  Skin: Skin is warm and dry.  Nursing note and vitals reviewed.   ED Course  Procedures (including critical care time) Labs Review Labs Reviewed  POCT URINALYSIS DIP (DEVICE) - Abnormal; Notable for the following:    Leukocytes, UA TRACE (*)    All other components within normal limits    Imaging Review No results found.   MDM   1. Constipation by delayed colonic  transit        Linna HoffJames D Amanda Steuart, MD 04/08/14 1222

## 2014-04-08 NOTE — ED Notes (Signed)
Pt  Has  Symptoms  Of  Constipation   Low  abd  Pain   With  Pain in  Sides   And  Thighs as  Well   Pt  Ambulates  With a  Slow   Although   Slightly    Bent  Over  Gait          denys  Any  Recent  Injury

## 2014-06-06 ENCOUNTER — Other Ambulatory Visit: Payer: Self-pay | Admitting: Family Medicine

## 2014-07-12 ENCOUNTER — Ambulatory Visit (INDEPENDENT_AMBULATORY_CARE_PROVIDER_SITE_OTHER): Payer: Medicare Other | Admitting: Family Medicine

## 2014-07-12 ENCOUNTER — Encounter: Payer: Self-pay | Admitting: Family Medicine

## 2014-07-12 VITALS — BP 164/80 | HR 82 | Temp 97.4°F | Resp 16 | Ht 64.5 in | Wt 211.0 lb

## 2014-07-12 DIAGNOSIS — M6530 Trigger finger, unspecified finger: Secondary | ICD-10-CM | POA: Diagnosis not present

## 2014-07-12 DIAGNOSIS — I10 Essential (primary) hypertension: Secondary | ICD-10-CM | POA: Diagnosis not present

## 2014-07-12 DIAGNOSIS — J301 Allergic rhinitis due to pollen: Secondary | ICD-10-CM | POA: Diagnosis not present

## 2014-07-12 DIAGNOSIS — M150 Primary generalized (osteo)arthritis: Secondary | ICD-10-CM

## 2014-07-12 DIAGNOSIS — E559 Vitamin D deficiency, unspecified: Secondary | ICD-10-CM

## 2014-07-12 DIAGNOSIS — Z0000 Encounter for general adult medical examination without abnormal findings: Secondary | ICD-10-CM

## 2014-07-12 DIAGNOSIS — M159 Polyosteoarthritis, unspecified: Secondary | ICD-10-CM

## 2014-07-12 DIAGNOSIS — M8949 Other hypertrophic osteoarthropathy, multiple sites: Secondary | ICD-10-CM

## 2014-07-12 LAB — POCT URINALYSIS DIPSTICK
Bilirubin, UA: NEGATIVE
Blood, UA: NEGATIVE
Glucose, UA: NEGATIVE
Ketones, UA: NEGATIVE
Leukocytes, UA: NEGATIVE
Nitrite, UA: NEGATIVE
Protein, UA: NEGATIVE
Spec Grav, UA: 1.01
Urobilinogen, UA: 0.2
pH, UA: 5

## 2014-07-12 LAB — CBC WITH DIFFERENTIAL/PLATELET
Basophils Absolute: 0 10*3/uL (ref 0.0–0.1)
Basophils Relative: 0 % (ref 0–1)
Eosinophils Absolute: 0.1 10*3/uL (ref 0.0–0.7)
Eosinophils Relative: 2 % (ref 0–5)
HCT: 42.6 % (ref 36.0–46.0)
Hemoglobin: 14.3 g/dL (ref 12.0–15.0)
Lymphocytes Relative: 34 % (ref 12–46)
Lymphs Abs: 2.3 10*3/uL (ref 0.7–4.0)
MCH: 26.5 pg (ref 26.0–34.0)
MCHC: 33.6 g/dL (ref 30.0–36.0)
MCV: 79 fL (ref 78.0–100.0)
MPV: 10.2 fL (ref 8.6–12.4)
Monocytes Absolute: 0.5 10*3/uL (ref 0.1–1.0)
Monocytes Relative: 7 % (ref 3–12)
Neutro Abs: 3.8 10*3/uL (ref 1.7–7.7)
Neutrophils Relative %: 57 % (ref 43–77)
Platelets: 209 10*3/uL (ref 150–400)
RBC: 5.39 MIL/uL — ABNORMAL HIGH (ref 3.87–5.11)
RDW: 15.9 % — ABNORMAL HIGH (ref 11.5–15.5)
WBC: 6.7 10*3/uL (ref 4.0–10.5)

## 2014-07-12 LAB — BASIC METABOLIC PANEL
BUN: 15 mg/dL (ref 6–23)
CO2: 30 mEq/L (ref 19–32)
Calcium: 11.6 mg/dL — ABNORMAL HIGH (ref 8.4–10.5)
Chloride: 99 mEq/L (ref 96–112)
Creat: 0.72 mg/dL (ref 0.50–1.10)
Glucose, Bld: 96 mg/dL (ref 70–99)
Potassium: 4.2 mEq/L (ref 3.5–5.3)
Sodium: 135 mEq/L (ref 135–145)

## 2014-07-12 LAB — LIPID PANEL
Cholesterol: 232 mg/dL — ABNORMAL HIGH (ref 0–200)
HDL: 41 mg/dL — ABNORMAL LOW (ref >=46)
LDL Cholesterol: 166 mg/dL — ABNORMAL HIGH (ref 0–99)
Total CHOL/HDL Ratio: 5.7 Ratio
Triglycerides: 123 mg/dL (ref <150)
VLDL: 25 mg/dL (ref 0–40)

## 2014-07-12 MED ORDER — FEXOFENADINE HCL 180 MG PO TABS: 180.0000 mg | ORAL_TABLET | Freq: Every day | ORAL | Status: DC

## 2014-07-12 MED ORDER — HYDROCHLOROTHIAZIDE 12.5 MG PO CAPS: 12.5000 mg | ORAL_CAPSULE | Freq: Every day | ORAL | Status: DC

## 2014-07-12 MED ORDER — OLOPATADINE HCL 0.1 % OP SOLN: 1.0000 [drp] | Freq: Two times a day (BID) | OPHTHALMIC | Status: DC

## 2014-07-12 MED ORDER — VITAMIN D (ERGOCALCIFEROL) 1.25 MG (50000 UNIT) PO CAPS: 50000.0000 [IU] | ORAL_CAPSULE | ORAL | Status: DC

## 2014-07-12 MED ORDER — DICLOFENAC SODIUM 1 % TD GEL: 4.0000 g | Freq: Four times a day (QID) | TRANSDERMAL | Status: DC

## 2014-07-12 NOTE — Patient Instructions (Signed)
[facility]last[facility] therapy is in [place]:  Makes compression stockings:  (Phone number [phone]) Color (black, ecru), thigh or calf length, open or closed toe, compression strength (20-30 is ideal)    Health Maintenance Adopting a healthy lifestyle and getting preventive care can go a long way to promote health and wellness. Talk with your health care provider about what schedule of regular examinations is right for you. This is a good chance for you to check in with your provider about disease prevention and staying healthy. In between checkups, there are plenty of things you can do on your own. Experts have done a lot of research about which lifestyle changes and preventive measures are most likely to keep you healthy. Ask your health care provider for more information. WEIGHT AND DIET  Eat a healthy diet  Be sure to include plenty of vegetables, fruits, low-fat dairy products, and lean protein.  Do not eat a lot of foods high in solid fats, added sugars, or salt.  Get regular exercise. This is one of the most important things you can do for your health.  Most adults should exercise for at least 150 minutes each week. The exercise should increase your heart rate and make you sweat (moderate-intensity exercise).  Most adults should also do strengthening exercises at least twice a week. This is in addition to the moderate-intensity exercise.  Maintain a healthy weight  Body mass index (BMI) is a measurement that can be used to identify possible weight problems. It estimates body fat based on height and weight. Your health care provider can help determine your BMI and help you achieve or maintain a healthy weight.  For females 11 years of age and older:   A BMI below 18.5 is considered underweight.  A BMI of 18.5 to 24.9 is normal.  A BMI of 25 to 29.9 is considered overweight.  A BMI of 30 and above is considered obese.  Watch levels of cholesterol and blood lipids  You should start  having your blood tested for lipids and cholesterol at 71 years of age, then have this test every 5 years.  You may need to have your cholesterol levels checked more often if:  Your lipid or cholesterol levels are high.  You are older than 71 years of age.  You are at high risk for heart disease.  CANCER SCREENING   Lung Cancer  Lung cancer screening is recommended for adults 61-36 years old who are at high risk for lung cancer because of a history of smoking.  A yearly low-dose CT scan of the lungs is recommended for people who:  Currently smoke.  Have quit within the past 15 years.  Have at least a 30-pack-year history of smoking. A pack year is smoking an average of one pack of cigarettes a day for 1 year.  Yearly screening should continue until it has been 15 years since you quit.  Yearly screening should stop if you develop a health problem that would prevent you from having lung cancer treatment.  Breast Cancer  Practice breast self-awareness. This means understanding how your breasts normally appear and feel.  It also means doing regular breast self-exams. Let your health care provider know about any changes, no matter how small.  If you are in your 20s or 30s, you should have a clinical breast exam (CBE) by a health care provider every 1-3 years as part of a regular health exam.  If you are 28 or older, have a CBE every year.  Also consider having a breast X-ray (mammogram) every year.  If you have a family history of breast cancer, talk to your health care provider about genetic screening.  If you are at high risk for breast cancer, talk to your health care provider about having an MRI and a mammogram every year.  Breast cancer gene (BRCA) assessment is recommended for women who have family members with BRCA-related cancers. BRCA-related cancers include:  Breast.  Ovarian.  Tubal.  Peritoneal cancers.  Results of the assessment will determine the need for  genetic counseling and BRCA1 and BRCA2 testing. Cervical Cancer Routine pelvic examinations to screen for cervical cancer are no longer recommended for nonpregnant women who are considered low risk for cancer of the pelvic organs (ovaries, uterus, and vagina) and who do not have symptoms. A pelvic examination may be necessary if you have symptoms including those associated with pelvic infections. Ask your health care provider if a screening pelvic exam is right for you.   The Pap test is the screening test for cervical cancer for women who are considered at risk.  If you had a hysterectomy for a problem that was not cancer or a condition that could lead to cancer, then you no longer need Pap tests.  If you are older than 65 years, and you have had normal Pap tests for the past 10 years, you no longer need to have Pap tests.  If you have had past treatment for cervical cancer or a condition that could lead to cancer, you need Pap tests and screening for cancer for at least 20 years after your treatment.  If you no longer get a Pap test, assess your risk factors if they change (such as having a new sexual partner). This can affect whether you should start being screened again.  Some women have medical problems that increase their chance of getting cervical cancer. If this is the case for you, your health care provider may recommend more frequent screening and Pap tests.  The human papillomavirus (HPV) test is another test that may be used for cervical cancer screening. The HPV test looks for the virus that can cause cell changes in the cervix. The cells collected during the Pap test can be tested for HPV.  The HPV test can be used to screen women 57 years of age and older. Getting tested for HPV can extend the interval between normal Pap tests from three to five years.  An HPV test also should be used to screen women of any age who have unclear Pap test results.  After 71 years of age, women  should have HPV testing as often as Pap tests.  Colorectal Cancer  This type of cancer can be detected and often prevented.  Routine colorectal cancer screening usually begins at 71 years of age and continues through 71 years of age.  Your health care provider may recommend screening at an earlier age if you have risk factors for colon cancer.  Your health care provider may also recommend using home test kits to check for hidden blood in the stool.  A small camera at the end of a tube can be used to examine your colon directly (sigmoidoscopy or colonoscopy). This is done to check for the earliest forms of colorectal cancer.  Routine screening usually begins at age 54.  Direct examination of the colon should be repeated every 5-10 years through 71 years of age. However, you may need to be screened more often if early forms  of precancerous polyps or small growths are found. Skin Cancer  Check your skin from head to toe regularly.  Tell your health care provider about any new moles or changes in moles, especially if there is a change in a mole's shape or color.  Also tell your health care provider if you have a mole that is larger than the size of a pencil eraser.  Always use sunscreen. Apply sunscreen liberally and repeatedly throughout the day.  Protect yourself by wearing long sleeves, pants, a wide-brimmed hat, and sunglasses whenever you are outside. HEART DISEASE, DIABETES, AND HIGH BLOOD PRESSURE   Have your blood pressure checked at least every 1-2 years. High blood pressure causes heart disease and increases the risk of stroke.  If you are between 70 years and 110 years old, ask your health care provider if you should take aspirin to prevent strokes.  Have regular diabetes screenings. This involves taking a blood sample to check your fasting blood sugar level.  If you are at a normal weight and have a low risk for diabetes, have this test once every three years after 71 years  of age.  If you are overweight and have a high risk for diabetes, consider being tested at a younger age or more often. PREVENTING INFECTION  Hepatitis B  If you have a higher risk for hepatitis B, you should be screened for this virus. You are considered at high risk for hepatitis B if:  You were born in a country where hepatitis B is common. Ask your health care provider which countries are considered high risk.  Your parents were born in a high-risk country, and you have not been immunized against hepatitis B (hepatitis B vaccine).  You have HIV or AIDS.  You use needles to inject street drugs.  You live with someone who has hepatitis B.  You have had sex with someone who has hepatitis B.  You get hemodialysis treatment.  You take certain medicines for conditions, including cancer, organ transplantation, and autoimmune conditions. Hepatitis C  Blood testing is recommended for:  Everyone born from [birth year] through 1967.  Anyone with known risk factors for hepatitis C. Sexually transmitted infections (STIs)  You should be screened for sexually transmitted infections (STIs) including gonorrhea and chlamydia if:  You are sexually active and are younger than 71 years of age.  You are older than 71 years of age and your health care provider tells you that you are at risk for this type of infection.  Your sexual activity has changed since you were last screened and you are at an increased risk for chlamydia or gonorrhea. Ask your health care provider if you are at risk.  If you do not have HIV, but are at risk, it may be recommended that you take a prescription medicine daily to prevent HIV infection. This is called pre-exposure prophylaxis (PrEP). You are considered at risk if:  You are sexually active and do not regularly use condoms or know the HIV status of your partner(s).  You take drugs by injection.  You are sexually active with a partner who has HIV. Talk with your  health care provider about whether you are at high risk of being infected with HIV. If you choose to begin PrEP, you should first be tested for HIV. You should then be tested every 3 months for as long as you are taking PrEP.  PREGNANCY   If you are premenopausal and you may become pregnant, ask your health  care provider about preconception counseling.  If you may become pregnant, take 400 to 800 micrograms (mcg) of folic acid every day.  If you want to prevent pregnancy, talk to your health care provider about birth control (contraception). OSTEOPOROSIS AND MENOPAUSE   Osteoporosis is a disease in which the bones lose minerals and strength with aging. This can result in serious bone fractures. Your risk for osteoporosis can be identified using a bone density scan.  If you are 64 years of age or older, or if you are at risk for osteoporosis and fractures, ask your health care provider if you should be screened.  Ask your health care provider whether you should take a calcium or vitamin D supplement to lower your risk for osteoporosis.  Menopause may have certain physical symptoms and risks.  Hormone replacement therapy may reduce some of these symptoms and risks. Talk to your health care provider about whether hormone replacement therapy is right for you.  HOME CARE INSTRUCTIONS   Schedule regular health, dental, and eye exams.  Stay current with your immunizations.   Do not use any tobacco products including cigarettes, chewing tobacco, or electronic cigarettes.  If you are pregnant, do not drink alcohol.  If you are breastfeeding, limit how much and how often you drink alcohol.  Limit alcohol intake to no more than 1 drink per day for nonpregnant women. One drink equals 12 ounces of beer, 5 ounces of wine, or 1 ounces of hard liquor.  Do not use street drugs.  Do not share needles.  Ask your health care provider for help if you need support or information about quitting  drugs.  Tell your health care provider if you often feel depressed.  Tell your health care provider if you have ever been abused or do not feel safe at home. Document Released: 11/10/2010 Document Revised: 09/11/2013 Document Reviewed: 03/29/2013 [facility] Patient Information 2015 [place], [place]. This information is not intended to replace advice given to you by your health care provider. Make sure you discuss any questions you have with your health care provider.

## 2014-07-12 NOTE — Progress Notes (Signed)
Subjective:  This chart was scribed for Yvonne Sidle, MD by Yvonne Mahoney, Medial Scribe. This patient was seen in room 27 and the patient's care was started at 2:03 PM.    Patient ID: Yvonne Mahoney, female    DOB: 01/02/1946, 69 y.o.   MRN: [id number]  Chief Complaint  Patient presents with  . Annual Exam    AWV-S    HPI HPI Comments: Yvonne Mahoney is a 69 y.o. female with PMHx of Hypertension and Osteoarthritis who presents to the [facility] for annual physical exam.   Patient shares that the skin surrounding her areola is has been itching. Patient attributes the irritation to her body wash. Patient shares that she has been treating the dry skin with vaseline.    Patient shares that she has stopped visiting her eye doctor, but she reports being told that she may be developing glaucoma; she has not visited him since. Patient is aware of her cataracts and states that will visit her eye doctor.   Patient shares that she plans to have a mammogram. Patient shares history family of pancreatic cancer in her sister.   Patient last received tetanus shot in 2010. Patient's Pneumovax and shingles vaccines are also up to date.    Patient retired from social work.   Patient Active Problem List   Diagnosis Date Noted  . Hypertension 05/19/2012  . Osteoarthritis 05/19/2012   Past Medical History  Diagnosis Date  . Allergy   . Hypertension   . Arthritis   . Cataract    Past Surgical History  Procedure Laterality Date  . Cholecystectomy    . Abdominal hysterectomy    . Bunionectomy  1986    both   Allergies  Allergen Reactions  . Penicillins     REACTION: allergy/hand swelling and itching   Prior to Admission medications   Medication Sig Start Date End Date Taking? Authorizing Provider  diclofenac sodium (VOLTAREN) 1 % GEL Apply 4 g topically 4 (four) times daily. 05/19/12  Yes Yvonne Sidle, MD  fexofenadine [facility] ALLERGY) 180 MG tablet  Take 1 tablet (180 mg total) by mouth daily. 10/21/13  Yes Carmelina Dane, MD  hydrochlorothiazide (MICROZIDE) 12.5 MG capsule Take 1 capsule (12.5 mg total) by mouth daily. 05/25/13  Yes Yvonne Sidle, MD  hydrochlorothiazide (MICROZIDE) 12.5 MG capsule TAKE ONE CAPSULE BY MOUTH EVERY DAY   Yes Yvonne Sidle, MD  olopatadine (PATANOL) 0.1 % ophthalmic solution Place 1 drop into both eyes 2 (two) times daily. 10/21/13  Yes Carmelina Dane, MD  polyethylene glycol [facility] / GLYCOLAX) packet Take 17 g by mouth daily. 04/08/14  Yes Linna Hoff, MD  promethazine-codeine (PHENERGAN WITH CODEINE) 6.25-10 MG/5ML syrup Take 5-10 mLs by mouth every 6 (six) hours as needed. 10/21/13  Yes Carmelina Dane, MD  Vitamin D, Ergocalciferol, (DRISDOL) 50000 UNITS CAPS capsule Take 1 capsule (50,000 Units total) by mouth every 7 (seven) days. PATIENT NEEDS OFFICE VISIT/LABS FOR ADDITIONAL REFILLS 06/07/14  Yes Yvonne Sidle, MD  zoster vaccine live, PF, (ZOSTAVAX) 56213 UNT/0.65ML injection Inject 19,400 Units into the skin once. 05/23/12   Yvonne Sidle, MD   History   Social History  . Marital Status: Divorced    Spouse Name: N/A  . Number of Children: N/A  . Years of Education: masters   Occupational History  . Social Worker    Social History Main Topics  . Smoking status: Never Smoker   . Smokeless tobacco:  Not on file  . Alcohol Use: No  . Drug Use: Not on file  . Sexual Activity: No     Comment: number of sex partners in the last 12 months- 0   Other Topics Concern  . Not on file   Social History Narrative   Exercise daily walking 5 x/week for 45 minutes to 1 hour     Review of Systems  Constitutional: Negative for fever and chills.       Objective:   Physical Exam  Constitutional: She appears well-developed.  Eyes: Pupils are equal, round, and reactive to light.  Bilateral cataracts   Neck: No thyromegaly present.  Cardiovascular: Normal rate.   Nursing note and  vitals reviewed.    Filed Vitals:   07/12/14 1345  BP: 164/80  Pulse: 82  Temp: 97.4 F (36.3 C)  TempSrc: Oral  Resp: 16  Height: 5' 4.5" (1.638 m)  Weight: 211 lb (95.709 kg)  SpO2: 97%        Assessment & Plan:   This chart was scribed in my presence and reviewed by me personally.    ICD-9-CM ICD-10-CM   1. Annual physical exam V70.0 Z00.00 CBC with Differential/Platelet     Basic metabolic panel     Lipid panel     POCT urinalysis dipstick  2. Essential hypertension 401.9 I10 hydrochlorothiazide (MICROZIDE) 12.5 MG capsule  3. Trigger finger 727.03 M65.30 diclofenac sodium (VOLTAREN) 1 % GEL  4. Trigger finger of left hand 727.03 M65.30 diclofenac sodium (VOLTAREN) 1 % GEL  5. Primary osteoarthritis involving multiple joints 715.09 M15.0 diclofenac sodium (VOLTAREN) 1 % GEL  6. Allergic rhinitis due to pollen 477.0 J30.1 olopatadine (PATANOL) 0.1 % ophthalmic solution     fexofenadine (ALLEGRA ALLERGY) 180 MG tablet  7. Vitamin D deficiency 268.9 E55.9 Vitamin D, Ergocalciferol, (DRISDOL) 50000 UNITS CAPS capsule     Signed, Yvonne SidleKurt Retha Bither, MD

## 2014-07-16 ENCOUNTER — Other Ambulatory Visit: Payer: Self-pay

## 2014-07-16 DIAGNOSIS — Z1231 Encounter for screening mammogram for malignant neoplasm of breast: Secondary | ICD-10-CM

## 2014-07-18 NOTE — Addendum Note (Signed)
Addended by: Johnnette LitterARDWELL, Perfecto Purdy M on: 07/18/2014 06:55 PM   Modules accepted: Orders, SmartSet

## 2014-07-20 ENCOUNTER — Ambulatory Visit
Admission: RE | Admit: 2014-07-20 | Discharge: 2014-07-20 | Disposition: A | Discharge status: Home / Self Care | Payer: Medicare Other | Source: Ambulatory Visit

## 2014-07-20 ENCOUNTER — Other Ambulatory Visit (INDEPENDENT_AMBULATORY_CARE_PROVIDER_SITE_OTHER): Payer: Medicare Other

## 2014-07-20 DIAGNOSIS — Z1231 Encounter for screening mammogram for malignant neoplasm of breast: Secondary | ICD-10-CM

## 2014-07-23 ENCOUNTER — Other Ambulatory Visit: Payer: Self-pay | Admitting: Family Medicine

## 2014-07-23 DIAGNOSIS — E349 Endocrine disorder, unspecified: Secondary | ICD-10-CM

## 2014-07-23 LAB — PTH, INTACT AND CALCIUM
Calcium: 11.9 mg/dL — ABNORMAL HIGH (ref 8.4–10.5)
PTH: 215 pg/mL — ABNORMAL HIGH (ref 14–64)

## 2014-07-30 ENCOUNTER — Telehealth: Payer: Self-pay

## 2014-07-30 DIAGNOSIS — E213 Hyperparathyroidism, unspecified: Secondary | ICD-10-CM

## 2014-07-30 NOTE — Telephone Encounter (Signed)
Patient has a very high parathyroid level which explains her symptoms.  I am setting up an urgent referral.  (The test just came back over the weekend)

## 2014-07-30 NOTE — Telephone Encounter (Signed)
Pt calling about PTH results. Please advise. Thanks

## 2014-07-31 NOTE — Telephone Encounter (Signed)
Notified pt. 

## 2014-08-09 ENCOUNTER — Other Ambulatory Visit: Payer: Self-pay | Admitting: Family Medicine

## 2014-08-09 ENCOUNTER — Telehealth: Payer: Self-pay

## 2014-08-09 NOTE — Telephone Encounter (Signed)
Dr L, it looks like pt has more pressing issues, but do you want to OK RFs on Vit D? I didn't see a vit D level run at [facility] so needed to get approval.

## 2014-08-09 NOTE — Telephone Encounter (Signed)
Patient has a referral appointment at [facility] Monday April 4th and they need her lab and x-ray results. Patient was advise to come in and fill out release. She states she'll come Monday before her appointment. Patient phone: [phone]

## 2014-08-09 NOTE — Telephone Encounter (Signed)
Actually if this was a referral, this message should go to Referrals to send records. Referrals, please call pt and advise whether she needs to come by or if we can just send what is needed. Thanks.

## 2014-08-09 NOTE — Telephone Encounter (Signed)
Medical records since it looks like we referred her, can we send records. Im confused. Please advise. If so, call pt to save her a trip here on Monday.

## 2014-08-13 ENCOUNTER — Encounter: Payer: Self-pay | Admitting: Endocrinology

## 2014-08-13 ENCOUNTER — Ambulatory Visit (INDEPENDENT_AMBULATORY_CARE_PROVIDER_SITE_OTHER): Payer: Medicare Other | Admitting: Endocrinology

## 2014-08-13 ENCOUNTER — Other Ambulatory Visit: Payer: Self-pay | Admitting: Family Medicine

## 2014-08-13 VITALS — BP 134/80 | HR 92 | Temp 98.2°F | Resp 12 | Ht 64.75 in | Wt 215.0 lb

## 2014-08-13 DIAGNOSIS — E559 Vitamin D deficiency, unspecified: Secondary | ICD-10-CM | POA: Diagnosis not present

## 2014-08-13 DIAGNOSIS — E210 Primary hyperparathyroidism: Secondary | ICD-10-CM | POA: Diagnosis not present

## 2014-08-13 MED ORDER — LOSARTAN POTASSIUM 100 MG PO TABS: 100.0000 mg | ORAL_TABLET | Freq: Every day | ORAL | Status: DC

## 2014-08-13 NOTE — Progress Notes (Signed)
Patient ID: Yvonne Mahoney, female   DOB: March 31, 1946, 69 y.o.   MRN: [id number]     Chief complaint: High calcium  History of Present Illness:  Referring physician: Lauenstein   Review of records show that she has had a high calcium since 05/2012:  Lab Results  Component Value Date   CALCIUM 11.9* 07/20/2014   CALCIUM 11.6* 07/12/2014   CALCIUM 10.9* 05/25/2013   CALCIUM 11.4* 05/19/2012   CALCIUM 10.0 11/18/2007    The hypercalcemia is not associated with any history of pathologic fractures, renal insufficiency, kidney stones, sarcoidosis, known carcinoma or known hyperthyroidism.  She does not consume excessive amounts of milk or calcium containing antacids  Patient has no symptoms of bone pain, nausea, abdominal discomfort or unusual fatigue. Her blood pressure has not been difficult to control. However she has been on hydrochlorothiazide for at least 10 years for hypertension  Prior serologic and radiologic studies have included:  Lab Results  Component Value Date   PTH 215* 07/20/2014   CALCIUM 11.9* 07/20/2014   Bone density: She thinks she had screening bone densities possibly of the heel about 3 years ago and this was reportedly normal, no record available Mammogram normal in 3/16, no recent chest x-ray available  [facility]) Vitamin D level was 23 in 05/2012 and she was started on 50,000 units of vitamin D weekly. In 2015 her level was 28 and she was told to take additional vitamin D 3, 1000 units daily She has taken her supplements fairly regularly although is waiting for her refill on the vitamin D prescription today        Medication List       This list is accurate as of: 08/13/14  2:14 PM.  Always use your most recent med list.               diclofenac sodium 1 % Gel  Commonly known as:  VOLTAREN  Apply 4 g topically 4 (four) times daily.     fexofenadine 180 MG tablet  Commonly known as:  ALLEGRA ALLERGY  Take 1 tablet (180 mg total) by mouth  daily.     hydrochlorothiazide 12.5 MG capsule  Commonly known as:  MICROZIDE  TAKE ONE CAPSULE BY MOUTH EVERY DAY     hydrochlorothiazide 12.5 MG capsule  Commonly known as:  MICROZIDE  Take 1 capsule (12.5 mg total) by mouth daily.     olopatadine 0.1 % ophthalmic solution  Commonly known as:  PATANOL  Place 1 drop into both eyes 2 (two) times daily.     polyethylene glycol packet  Commonly known as:  MIRALAX / GLYCOLAX  Take 17 g by mouth daily.     Vitamin D (Ergocalciferol) 50000 UNITS Caps capsule  Commonly known as:  DRISDOL  TAKE ONE CAPSULE BY MOUTH WEEKLY.     zoster vaccine live (PF) 19400 UNT/0.65ML injection  Commonly known as:  ZOSTAVAX  Inject 19,400 Units into the skin once.        Allergies:  Allergies  Allergen Reactions  . Penicillins     REACTION: allergy/hand swelling and itching    Past Medical History  Diagnosis Date  . Allergy   . Hypertension   . Arthritis   . Cataract   . GERD (gastroesophageal reflux disease)     Past Surgical History  Procedure Laterality Date  . Cholecystectomy    . Abdominal hysterectomy    . Bunionectomy  1986    both    Family  History  Problem Relation Age of Onset  . Dementia Mother   . Hypertension Mother   . Cancer Father   . Cancer Sister   . Hyperlipidemia Sister   . Hypertension Sister     Social History:  reports that she has never smoked. She does not have any smokeless tobacco history on file. She reports that she does not drink alcohol. Her drug history is not on file.  Review of Systems  Constitutional: Negative for malaise/fatigue.  HENT: Positive for congestion.   Eyes: Negative for blurred vision.  Respiratory: Negative for shortness of breath.   Cardiovascular: Negative for palpitations and leg swelling.  Gastrointestinal: Positive for abdominal pain. Negative for constipation.       Rare pain  Genitourinary: Negative for frequency.  Musculoskeletal: Positive for joint pain.  Negative for myalgias.       Mild, fingers or knees  Skin: Negative for rash.  Neurological: Negative for dizziness.  Endo/Heme/Allergies: Negative for polydipsia.  Psychiatric/Behavioral: Negative for depression.     EXAM:  BP 134/80 mmHg  Pulse 92  Temp(Src) 98.2 F (36.8 C) (Oral)  Resp 12  Ht 5' 4.75" (1.645 m)  Wt 215 lb (97.523 kg)  BMI 36.04 kg/m2  SpO2 97%  GENERAL: Averagely built and has mild generalized obesity  No pallor, clubbing, lymphadenopathy or edema.  Skin:  no rash or pigmentation.  EYES:  Externally normal.  Fundii:  normal discs and vessels,  Not well-seen on the right.  ENT: Oral mucosa and tongue normal.  THYROID:  Not palpable.  HEART:  Normal  S1 and S2; no murmur or click.  CHEST:  Normal shape Lungs:   Vescicular breath sounds heard equally.  No crepitations/ wheeze.  ABDOMEN:  No distention.  Liver and spleen not palpable.  No other mass or tenderness.  NEUROLOGICAL: .Reflexes are normal bilaterally at biceps,  Decreased at ankles.  SPINE AND JOINTS:  Normal  Appearance.  Assessment:   HYPERCALCEMIA:  Patient appears to have had hypocalcemia since at least 2014 Her recent PTH level is significantly high indicating primary hyperparathyroidism Currently the patient is asymptomatic However not clear if she has had any osteopenia, no recent bone densities available.  At this time the patient is on high-dose vitamin D supplements over the last 2 years and not clear if this may be affecting her calcium level However she does take HCTZ which would tend to raise her calcium levels beyond her baseline  Discussed the nature of primary hyperparathyroidism as well as normal role of the parathyroid glands. Discussed potential  effects of hyperparathyroidism long-term on bone health, kidney stones and kidney function Explained to patient that surgery is indicated only there are symptoms of high calcium, a level over 1 point above the normal range  or known osteoporosis. Explained that if surgery is indicated this would be done after doing a parathyroid scan and most likely if the patient has single adenoma will need minimally invasive surgery  PLAN:   Since her calcium level is on the borderline for a surgical indication she may be better managed by stopping her HCTZ which would tend to cause more calcium retention. Will request her PCP to switch her to another drug  Also would like to assess her vitamin D status including 1, 25-hydroxy vitamin D to assess the need for supplementation and appropriate dose Will check her serum protein electrophoresis also to rule out underlying abnormality of gammaglobulin  At this time the patient is fairly reluctant to  consider surgery; may need to do another bone density to see if she has had any significant  effects of the hyperparathyroidism since she is several years postmenopausal  She will be seen in follow-up in 6 weeks after switching her HCTZ to reassess her calcium level and consider doing another bone density at that time  [facility] Medcenter[facility]UMAR,Cayde Held 08/13/2014, 2:14 PM

## 2014-08-13 NOTE — Patient Instructions (Signed)
Hypercalcemia Hypercalcemia means the calcium in your blood is too high. Calcium in our blood is important for the control of many things, such as:  Blood clotting.  Conducting of nerve impulses.  Muscle contraction.  Maintaining teeth and bone health.  Other body functions. In the bloodstream, calcium maintains a constant balance with another mineral, phosphate. Calcium is absorbed into the body through the small intestine. This is helped by vitamin D. Calcium levels are maintained mostly by vitamin D and a hormone (parathyroid hormone). But the kidneys also help. Hypercalcemia can happen when the concentration of calcium is too high for the kidneys to maintain balance. The body maintains a balance between the calcium we eat and the calcium already in our body. If calcium intake is increased or we cannot use calcium properly, there may be problems. Some common sources of calcium are:   Dairy products.  Nuts.  Eggs.  Whole grains.  Legumes.  Green leafy vegetables. CAUSES There are many causes of this condition, but some common ones are:  Hyperparathyroidism. This is an overactivity of the parathyroid gland.  Cancers of the breast, kidney, lung, head, and neck are common causes of calcium increases.  Medications that cause you to urinate more often (diuretics), nausea, vomiting, and diarrhea also increase the calcium in the blood.  Overuse of calcium-containing antacids. SYMPTOMS  Many patients with mild hypercalcemia have no symptoms. For those with symptoms, common problems include:  Loss of appetite.  Constipation.  Increased thirst.  Heart rhythm changes.  Abnormal thinking.  Nausea.  Abdominal pain.  Kidney stones.  Mood swings.  Coma and death when severe.  Vomiting.  Increased urination.  High blood pressure.  Confusion. DIAGNOSIS   Your caregiver will do a medical history and perform a physical exam on you.  Calcium and parathyroid hormone  (PTH) may be measured with a blood test. TREATMENT   The treatment depends on the calcium level and what is causing the higher level. Hypercalcemia can be life threatening. Fast lowering of the calcium level may be necessary.  With normal kidney function, fluids can be given by vein to clear the excess calcium. Hemodialysis works well to reduce dangerous calcium levels if there is poor kidney function. This is a procedure in which a machine is used to filter out unwanted substances. The blood is then returned to the body.  Drugs, such as diuretics, can be given after adequate fluid intake is established. These medications help the kidneys get rid of extra calcium. Drugs that lessen (inhibit) bone loss are helpful in gaining long-term control. Phosphate pills help lower high calcium levels caused by a low supply of phosphate. Anti-inflammatory agents such as steroids are helpful with some cancers and toxic levels of vitamin D.  Treatment of the underlying cause of the hypercalcemia will also correct the imbalance. Hyperparathyroidism is usually treated by surgical removal of one or more of the parathyroid glands and any tissue, other than the glands themselves, that is producing too much hormone.  The hypercalcemia caused by cancer is difficult to treat without controlling the cancer. Symptoms can be improved with fluids and drug therapy as outlined above. PROGNOSIS   Surgery to remove the parathyroid glands is usually successful. This also depends on the amount of damage to the kidneys and whether or not it can be treated.  Mild hypercalcemia can be controlled with good fluid intake and the use of effective medications.  Hypercalcemia often develops as a late complication of cancer. The expected outlook   is poor without effective anticancer therapy. PREVENTION   If you are at risk for developing hypercalcemia, be familiar with early symptoms. Report these to your caregiver.  Good fluid intake  (up to four quarts of liquid a day if possible) is helpful.  Try to control nausea and vomiting, and treat fevers to avoid dehydration.  Lowering the amount of calcium in your diet is not necessary. High blood calcium reduces absorption of calcium in the intestine.  Stay as active as possible. SEEK IMMEDIATE MEDICAL CARE IF:   You develop chest pain, sweating, or shortness of breath.  You get confused, feel faint or pass out.  You develop severe nausea and vomiting. MAKE SURE YOU:   Understand these instructions.  Will watch your condition.  Will get help right away if you are not doing well or get worse. Document Released: 07/11/2004 Document Revised: 09/11/2013 Document Reviewed: 04/22/2010 Exit[employer] Patient Information 2015 [employer]. This information is not intended to replace advice given to you by your health care provider. Make sure you discuss any questions you have with your health care provider.  

## 2014-08-14 NOTE — Progress Notes (Signed)
My bad.  I must have been relying on the medication list from the last visit and Dr Remus BlakeKumar's consultation advice.

## 2014-08-19 LAB — PROTEIN ELECTROPHORESIS, SERUM
A/G RATIO SPE: 1 (ref 0.7–2.0)
ALBUMIN ELP: 3.6 g/dL (ref 3.2–5.6)
Alpha 1: 0.2 g/dL (ref 0.1–0.4)
Alpha 2: 0.6 g/dL (ref 0.4–1.2)
BETA: 1.3 g/dL (ref 0.6–1.3)
GLOBULIN, TOTAL: 3.5 g/dL (ref 2.0–4.5)
Gamma Globulin: 1.4 g/dL (ref 0.5–1.6)
TOTAL PROTEIN: 7.1 g/dL (ref 6.0–8.5)

## 2014-08-19 LAB — VITAMIN D 25 HYDROXY (VIT D DEFICIENCY, FRACTURES): Vit D, 25-Hydroxy: 46 ng/mL (ref 30.0–100.0)

## 2014-08-19 LAB — VITAMIN D 1,25 DIHYDROXY
Vitamin D 1, 25 (OH)2 Total: 48 pg/mL
Vitamin D2 1, 25 (OH)2: 23 pg/mL
Vitamin D3 1, 25 (OH)2: 25 pg/mL

## 2014-08-19 NOTE — Progress Notes (Signed)
Quick Note:  Please let patient know that the vitamin D level is now normal, other tests okay. Need to reduce the 50,000 units vitamin D to once a month only until next visit  ______

## 2014-08-20 ENCOUNTER — Telehealth: Payer: Self-pay | Admitting: Endocrinology

## 2014-08-20 ENCOUNTER — Other Ambulatory Visit: Payer: Self-pay

## 2014-08-20 MED ORDER — VITAMIN D (ERGOCALCIFEROL) 1.25 MG (50000 UNIT) PO CAPS: 50000.0000 [IU] | ORAL_CAPSULE | ORAL | Status: DC

## 2014-08-20 NOTE — Telephone Encounter (Signed)
Please call in RX 50,000 mcg a month instead of weekly. Please advise the pt on what she is supposed to be taking. She has questions about blood pressure as well

## 2014-08-20 NOTE — Telephone Encounter (Signed)
erx done.   Pt has the results from recent lab work. Note to Dr. Lucianne MussKumar sent via result note, awaiting MD response.

## 2014-08-20 NOTE — Telephone Encounter (Signed)
Pt needs results of testing

## 2014-08-21 ENCOUNTER — Telehealth: Payer: Self-pay | Admitting: Endocrinology

## 2014-08-21 ENCOUNTER — Telehealth: Payer: Self-pay

## 2014-08-21 NOTE — Telephone Encounter (Signed)
Losartan is a very weak medicine, so 100 mg is necessary.  Lower doses just don't work. Think of it this way:  We give 1,000,000 units of penicillin for the same thing we give 250 mg of azithromycin

## 2014-08-21 NOTE — Telephone Encounter (Signed)
Pt LM on my Vm asking for CB concerning explanation as to why her BP med was increased so drastically from 12.5 mg HCTZ to 100 mg of Losartan. I called pt to advise that the two medications work in different ways and that 100 mg of losartan is not equivalent to 100 mg of HCTZ, and that Dr L Rxd the dose of losartan that he thought pt should be on in order to control her BP as the 12.5 dose of HCTZ was. Pt is still leery of starting off on too high a dose of losartan since it is a new med and she lives alone. She doesn't want to be so highly medicated if she does not need to be. Pt has NOT picked up Rx of 100 mg yet, Dr L, do you want to change the dosage, or have pt taper up in some way?

## 2014-08-22 NOTE — Telephone Encounter (Signed)
error 

## 2014-08-23 NOTE — Telephone Encounter (Signed)
Unable to leave voicemail on either phone.

## 2014-09-07 ENCOUNTER — Telehealth: Payer: Self-pay

## 2014-09-07 NOTE — Telephone Encounter (Signed)
Medication has increased the patients blood pressure to dangerously high levels.  States it is to strong for her.   New script.    [facility] - [place], [place] - [address]    losartan (COZAAR) 100 MG tablet   Makes her sick  [phone]

## 2014-09-08 NOTE — Telephone Encounter (Signed)
Called patient to reschedule her appt she states that since she has been on Lasartan she has been having really bad headaches and can hear her heart beat she hasn't been on the medicine for a few days now her readings has been 167/98 167/82 147/98

## 2014-09-08 NOTE — Telephone Encounter (Signed)
Need to be seen to discuss options at this point

## 2014-09-09 NOTE — Telephone Encounter (Signed)
I have advised patient to come in for this. Patient states she can not come in today. She states she is having some surgery on Thursday/ I have encouraged her to come in before then if she has elevated BP and headaches/ she wants this taken care of prior to any surgical procedures. Patient states she understands and will come in soon.

## 2014-09-10 ENCOUNTER — Ambulatory Visit (INDEPENDENT_AMBULATORY_CARE_PROVIDER_SITE_OTHER): Payer: Medicare Other | Admitting: Emergency Medicine

## 2014-09-10 VITALS — BP 144/92 | HR 82 | Temp 97.8°F | Resp 17 | Ht 65.5 in | Wt 212.0 lb

## 2014-09-10 DIAGNOSIS — I10 Essential (primary) hypertension: Secondary | ICD-10-CM

## 2014-09-10 DIAGNOSIS — R002 Palpitations: Secondary | ICD-10-CM

## 2014-09-10 DIAGNOSIS — R9431 Abnormal electrocardiogram [ECG] [EKG]: Secondary | ICD-10-CM | POA: Diagnosis not present

## 2014-09-10 DIAGNOSIS — E213 Hyperparathyroidism, unspecified: Secondary | ICD-10-CM | POA: Diagnosis not present

## 2014-09-10 MED ORDER — AMLODIPINE BESYLATE 5 MG PO TABS: 5.0000 mg | ORAL_TABLET | Freq: Every day | ORAL | Status: DC

## 2014-09-10 NOTE — Progress Notes (Addendum)
Subjective:  This chart was scribed for Collene GobbleSteven A Daub, MD by Yvonne Mahoney, ED Scribe. The patient was seen in room 4. Patient's care was started at 8:29 AM.   Patient ID: Yvonne Mahoney, female    DOB: Dec 29, 1945, 69 y.o.   MRN: [id number]  Chief Complaint  Patient presents with  . Hypertension  . Medication Problem   HPI HPI Comments: Yvonne Mahoney is a 69 y.o. female, with a h/o HTN, arthritis, who presents to the [facility] complaining of elevated BP levels. Pt was seen by endocrinologist Dr. Lucianne MussKumar diagnosed with parathyroid adenoma and advised to come off HCTZ. She states that Dr. Lucianne MussKumar suggested that elevated BP was due to elevated Vitamin D. She was switched from HCTZ to Losartan 100 mg x1 daily. Pt reports associated HA, palpitations, decreased urine output since starting Losartan. She states that she took her BP at home yesterday morning with a reading of 157/98 and yesterday afternoon at 152/92.  Past Medical History  Diagnosis Date  . Allergy   . Hypertension   . Arthritis   . Cataract   . GERD (gastroesophageal reflux disease)    Current Outpatient Prescriptions on File Prior to Visit  Medication Sig Dispense Refill  . diclofenac sodium (VOLTAREN) 1 % GEL Apply 4 g topically 4 (four) times daily. 100 g 1  . fexofenadine (ALLEGRA ALLERGY) 180 MG tablet Take 1 tablet (180 mg total) by mouth daily. 20 tablet 0  . losartan (COZAAR) 100 MG tablet Take 1 tablet (100 mg total) by mouth daily. 90 tablet 3  . olopatadine (PATANOL) 0.1 % ophthalmic solution Place 1 drop into both eyes 2 (two) times daily. 5 mL 1  . polyethylene glycol (MIRALAX / GLYCOLAX) packet Take 17 g by mouth daily. 30 each 0  . Vitamin D, Ergocalciferol, (DRISDOL) 50000 UNITS CAPS capsule Take 1 capsule (50,000 Units total) by mouth every 30 (thirty) days. 3 capsule 0  . zoster vaccine live, PF, (ZOSTAVAX) [id number] UNT/0.65ML injection Inject 19,400 Units into the skin once. 1 each  0  . cholecalciferol (VITAMIN D) 1000 UNITS tablet Take 1,000 Units by mouth daily.     No current facility-administered medications on file prior to visit.   Allergies  Allergen Reactions  . Penicillins     REACTION: allergy/hand swelling and itching   Review of Systems  Cardiovascular: Positive for palpitations.  Genitourinary: Positive for decreased urine volume.  Neurological: Positive for headaches.   BP 144/92 mmHg  Pulse 82  Temp(Src) 97.8 F (36.6 C) (Oral)  Resp 17  Ht 5' 5.5" (1.664 m)  Wt 212 lb (96.163 kg)  BMI 34.73 kg/m2  SpO2 98%    Objective:   Physical Exam  BP on L: 170/100; BP on R: 170/90 CONSTITUTIONAL: Well developed/well nourished HEAD: Normocephalic/atraumatic EYES: EOMI/PERRL ENMT: Mucous membranes moist NECK: supple no meningeal signs SPINE/BACK:entire spine nontender CV: S1/S2 noted, no murmurs/rubs/gallops noted LUNGS: Lungs are clear to auscultation bilaterally, no apparent distress ABDOMEN: soft, nontender, no rebound or guarding, bowel sounds noted throughout abdomen GU:no cva tenderness NEURO: Pt is awake/alert/appropriate, moves all extremitiesx4.  No facial droop.   EXTREMITIES: pulses normal/equal, full ROM SKIN: warm, color normal PSYCH: no abnormalities of mood noted, alert and oriented to situation   EKG normal sinus rhythm. 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 Her HCTZ had been stopped. She is not tolerating her losartan.  We'll try Norvasc 5 mg 1 a day. We'll repeat blood pressure 1 week. I advised her to cancel her eye surgery.I personally performed the services described in this documentation, which was scribed in my presence. The recorded information has been reviewed and is accurate.

## 2014-09-17 ENCOUNTER — Ambulatory Visit (INDEPENDENT_AMBULATORY_CARE_PROVIDER_SITE_OTHER): Payer: Medicare Other | Admitting: Emergency Medicine

## 2014-09-17 VITALS — BP 142/70 | HR 83 | Temp 97.7°F | Resp 18 | Ht 66.0 in | Wt 211.0 lb

## 2014-09-17 DIAGNOSIS — I10 Essential (primary) hypertension: Secondary | ICD-10-CM

## 2014-09-17 DIAGNOSIS — E559 Vitamin D deficiency, unspecified: Secondary | ICD-10-CM

## 2014-09-17 DIAGNOSIS — H269 Unspecified cataract: Secondary | ICD-10-CM

## 2014-09-17 MED ORDER — AMLODIPINE BESYLATE 10 MG PO TABS: 10.0000 mg | ORAL_TABLET | Freq: Every day | ORAL | Status: AC

## 2014-09-17 NOTE — Progress Notes (Signed)
[facility] [address], [place] [id number] [phone]  Date:  09/17/2014   Name:  Yvonne Mahoney   DOB:  1945-07-09   MRN:  [id number]  PCP:  Elvina SidleLAUENSTEIN,KURT, MD    Chief Complaint: Follow-up and Hypertension   History of Present Illness:  Yvonne Mahoney is a 69 y.o. very pleasant female patient who presents with the following:  History of hypercalcemia and stopped her hctz.  Failed to tolerate losartin so now on amlodipine Her BP at home is running 145-155 and as a consequence they postponed her cataract surgery She is concerned about her relatively uncontrolled hypertension Has no understanding of her hypercalcemia nor her need to avoid HCTZ. Denies other complaint or health concern today.   Patient Active Problem List   Diagnosis Date Noted  . Hyperparathyroidism, primary 08/13/2014  . Hypertension 05/19/2012  . Osteoarthritis 05/19/2012    Past Medical History  Diagnosis Date  . Allergy   . Hypertension   . Arthritis   . Cataract   . GERD (gastroesophageal reflux disease)     Past Surgical History  Procedure Laterality Date  . Cholecystectomy    . Abdominal hysterectomy    . Bunionectomy  1986    both    History  Substance Use Topics  . Smoking status: Never Smoker   . Smokeless tobacco: Not on file  . Alcohol Use: No    Family History  Problem Relation Age of Onset  . Dementia Mother   . Hypertension Mother   . Cancer Father   . Cancer Sister   . Hyperlipidemia Sister   . Hypertension Sister   . Thyroid disease Maternal Aunt   . Diabetes Paternal Aunt     Allergies  Allergen Reactions  . Penicillins     REACTION: allergy/hand swelling and itching    Medication list has been reviewed and updated.  Current Outpatient Prescriptions on File Prior to Visit  Medication Sig Dispense Refill  . amLODipine (NORVASC) 5 MG tablet Take 1 tablet (5 mg total) by mouth daily. 90 tablet 3  . diclofenac sodium  (VOLTAREN) 1 % GEL Apply 4 g topically 4 (four) times daily. 100 g 1  . fexofenadine (ALLEGRA ALLERGY) 180 MG tablet Take 1 tablet (180 mg total) by mouth daily. 20 tablet 0  . olopatadine (PATANOL) 0.1 % ophthalmic solution Place 1 drop into both eyes 2 (two) times daily. 5 mL 1  . polyethylene glycol (MIRALAX / GLYCOLAX) packet Take 17 g by mouth daily. 30 each 0  . Vitamin D, Ergocalciferol, (DRISDOL) 50000 UNITS CAPS capsule Take 1 capsule (50,000 Units total) by mouth every 30 (thirty) days. 3 capsule 0  . zoster vaccine live, PF, (ZOSTAVAX) [id number] UNT/0.65ML injection Inject 19,400 Units into the skin once. 1 each 0   No current facility-administered medications on file prior to visit.    Review of Systems:  Review of Systems  Constitutional: Negative for fever, chills and fatigue.  HENT: Negative for congestion, ear pain, hearing loss, postnasal drip, rhinorrhea and sinus pressure.   Eyes: Negative for discharge and redness.  Respiratory: Negative for cough, shortness of breath and wheezing.   Cardiovascular: Negative for chest pain and leg swelling.  Gastrointestinal: Negative for nausea, vomiting, abdominal pain, constipation and blood in stool.  Genitourinary: Negative for dysuria, urgency and frequency.  Musculoskeletal: Negative for neck stiffness.  Skin: Negative for rash.  Neurological: Negative for seizures, weakness and headaches.   Physical Examination: Ceasar MonsFiled  Vitals:   09/17/14 0842  BP: 142/70  Pulse: 83  Temp: 97.7 F (36.5 C)  Resp: 18   Filed Vitals:   09/17/14 0842  Height: 5\' 6"  (1.676 m)  Weight: 211 lb (95.709 kg)   Body mass index is 34.07 kg/(m^2). Ideal Body Weight: Weight in (lb) to have BMI = 25: 154.6   GEN: WDWN, NAD, Non-toxic, Alert & Oriented x 3 HEENT: Atraumatic, Normocephalic.  Ears and Nose: No external deformity. EXTR: No clubbing/cyanosis/edema NEURO: Normal gait.  PSYCH: Normally interactive. Conversant. Not depressed or anxious  appearing.  Calm demeanor.    Assessment and Plan:  Essential hypertension, benign  Vitamin D deficiency  Hypercalcemia  Cataract  Will increase dose of amlodipine and have her check her pressure daily Follow up with Dr Lucianne MussKumar and ophthalmology Follow up here in one month for Dr Cleta Albertsaub    Signed Phillips OdorJeffery Onika Gudiel, MD

## 2014-09-17 NOTE — Patient Instructions (Signed)

## 2014-09-19 ENCOUNTER — Telehealth: Payer: Self-pay | Admitting: Endocrinology

## 2014-09-19 ENCOUNTER — Ambulatory Visit: Payer: Medicare Other

## 2014-09-19 ENCOUNTER — Ambulatory Visit (INDEPENDENT_AMBULATORY_CARE_PROVIDER_SITE_OTHER): Payer: Medicare Other

## 2014-09-19 ENCOUNTER — Other Ambulatory Visit: Payer: Self-pay | Admitting: *Deleted

## 2014-09-19 ENCOUNTER — Other Ambulatory Visit: Payer: Medicare Other

## 2014-09-19 ENCOUNTER — Ambulatory Visit (INDEPENDENT_AMBULATORY_CARE_PROVIDER_SITE_OTHER): Payer: Medicare Other | Admitting: Emergency Medicine

## 2014-09-19 VITALS — BP 150/90 | HR 98 | Temp 97.9°F | Resp 16 | Ht 66.0 in | Wt 210.8 lb

## 2014-09-19 DIAGNOSIS — M545 Low back pain, unspecified: Secondary | ICD-10-CM

## 2014-09-19 DIAGNOSIS — E213 Hyperparathyroidism, unspecified: Secondary | ICD-10-CM

## 2014-09-19 DIAGNOSIS — I10 Essential (primary) hypertension: Secondary | ICD-10-CM | POA: Diagnosis not present

## 2014-09-19 LAB — POCT URINALYSIS DIPSTICK
Bilirubin, UA: NEGATIVE
Blood, UA: NEGATIVE
Glucose, UA: NEGATIVE
Ketones, UA: NEGATIVE
LEUKOCYTES UA: NEGATIVE
Nitrite, UA: NEGATIVE
Protein, UA: NEGATIVE
Spec Grav, UA: 1.02
Urobilinogen, UA: 0.2
pH, UA: 5.5

## 2014-09-19 LAB — POCT CBC
Granulocyte percent: 70.8 %G (ref 37–80)
HCT, POC: 43.1 % (ref 37.7–47.9)
Hemoglobin: 13.9 g/dL (ref 12.2–16.2)
LYMPH, POC: 2.4 (ref 0.6–3.4)
MCH: 25.9 pg — AB (ref 27–31.2)
MCHC: 32.3 g/dL (ref 31.8–35.4)
MCV: 80.4 fL (ref 80–97)
MID (CBC): 0.6 (ref 0–0.9)
MPV: 7.3 fL (ref 0–99.8)
PLATELET COUNT, POC: 249 10*3/uL (ref 142–424)
POC Granulocyte: 7.2 — AB (ref 2–6.9)
POC LYMPH PERCENT: 23.6 %L (ref 10–50)
POC MID %: 5.6 %M (ref 0–12)
RBC: 5.36 M/uL (ref 4.04–5.48)
RDW, POC: 15.2 %
WBC: 10.2 10*3/uL (ref 4.6–10.2)

## 2014-09-19 LAB — POCT UA - MICROSCOPIC ONLY
Casts, Ur, LPF, POC: NEGATIVE
Crystals, Ur, HPF, POC: NEGATIVE
RENAL TUBULAR CELLS: POSITIVE
YEAST UA: NEGATIVE

## 2014-09-19 LAB — IFOBT (OCCULT BLOOD): IFOBT: NEGATIVE

## 2014-09-19 MED ORDER — METHOCARBAMOL 500 MG PO TABS: 500.0000 mg | ORAL_TABLET | Freq: Four times a day (QID) | ORAL | Status: DC

## 2014-09-19 MED ORDER — METOPROLOL SUCCINATE ER 50 MG PO TB24: ORAL_TABLET | ORAL | Status: DC

## 2014-09-19 NOTE — Patient Instructions (Signed)
Back Pain, Adult Low back pain is very common. About 1 in 5 people have back pain.The cause of low back pain is rarely dangerous. The pain often gets better over time.About half of people with a sudden onset of back pain feel better in just 2 weeks. About 8 in 10 people feel better by 6 weeks.  CAUSES Some common causes of back pain include:  Strain of the muscles or ligaments supporting the spine.  Wear and tear (degeneration) of the spinal discs.  Arthritis.  Direct injury to the back. DIAGNOSIS Most of the time, the direct cause of low back pain is not known.However, back pain can be treated effectively even when the exact cause of the pain is unknown.Answering your caregiver's questions about your overall health and symptoms is one of the most accurate ways to make sure the cause of your pain is not dangerous. If your caregiver needs more information, he or she may order lab work or imaging tests (X-rays or MRIs).However, even if imaging tests show changes in your back, this usually does not require surgery. HOME CARE INSTRUCTIONS For many people, back pain returns.Since low back pain is rarely dangerous, it is often a condition that people can learn to manageon their own.   Remain active. It is stressful on the back to sit or stand in one place. Do not sit, drive, or stand in one place for more than 30 minutes at a time. Take short walks on level surfaces as soon as pain allows.Try to increase the length of time you walk each day.  Do not stay in bed.Resting more than 1 or 2 days can delay your recovery.  Do not avoid exercise or work.Your body is made to move.It is not dangerous to be active, even though your back may hurt.Your back will likely heal faster if you return to being active before your pain is gone.  Pay attention to your body when you bend and lift. Many people have less discomfortwhen lifting if they bend their knees, keep the load close to their bodies,and  avoid twisting. Often, the most comfortable positions are those that put less stress on your recovering back.  Find a comfortable position to sleep. Use a firm mattress and lie on your side with your knees slightly bent. If you lie on your back, put a pillow under your knees.  Only take over-the-counter or prescription medicines as directed by your caregiver. Over-the-counter medicines to reduce pain and inflammation are often the most helpful.Your caregiver may prescribe muscle relaxant drugs.These medicines help dull your pain so you can more quickly return to your normal activities and healthy exercise.  Put ice on the injured area.  Put ice in a plastic bag.  Place a towel between your skin and the bag.  Leave the ice on for 15-20 minutes, 03-04 times a day for the first 2 to 3 days. After that, ice and heat may be alternated to reduce pain and spasms.  Ask your caregiver about trying back exercises and gentle massage. This may be of some benefit.  Avoid feeling anxious or stressed.Stress increases muscle tension and can worsen back pain.It is important to recognize when you are anxious or stressed and learn ways to manage it.Exercise is a great option. SEEK MEDICAL CARE IF:  You have pain that is not relieved with rest or medicine.  You have pain that does not improve in 1 week.  You have new symptoms.  You are generally not feeling well. SEEK   IMMEDIATE MEDICAL CARE IF:   You have pain that radiates from your back into your legs.  You develop new bowel or bladder control problems.  You have unusual weakness or numbness in your arms or legs.  You develop nausea or vomiting.  You develop abdominal pain.  You feel faint. Document Released: 04/27/2005 Document Revised: 10/27/2011 Document Reviewed: 08/29/2013 [employer] Patient Information 2015 [employer]. This information is not intended to replace advice given to you by your health care provider. Make sure you  discuss any questions you have with your health care provider.  

## 2014-09-19 NOTE — Telephone Encounter (Signed)
I spoke with Mrs. Trefry and per Dr. Lucianne MussKumar told her to call her PCP about her blood pressure issue. She agreed.

## 2014-09-19 NOTE — Telephone Encounter (Signed)
Amlodipine is causing her b/p to run high 180/92 please advise

## 2014-09-19 NOTE — Progress Notes (Addendum)
Subjective:  This chart was scribed for Yvonne LitesSteve Mckaylah Bettendorf, MD  by Yvonne Mahoney, ED Scribe. This patient was seen in room 8 and the patient's care was started at 11:31 AM.    Patient ID: Yvonne Mahoney, female    DOB: 1946/04/06, 69 y.o.   MRN: [id number] Chief Complaint  Patient presents with  . Follow-up    hypertension   . Flank Pain    left side, started lastnight    HPI  HPI Comments: Yvonne Mahoney is a 69 y.o. female with Hx of HTN and Hysterectomy who presents to the [facility] complaining constant, moderate left flank pain beginning last night. The patient reports that it is difficult to lie on her left side due to pain, and that  her BP last night was 180/92 and that her pulse was high (ranging 80-90). She states that she has not been stressed out currently, and has been doing the same routine she is used to, which includes sitting at a computer a lot. She states that she usually lies on her left side to watch TV, but was unable to last night. She is currently taking amlodipine, and was recently advised to switch from 5mg  to 10mg  daily. The patient denies any abdominal pain and GI distress   Preventive Maintenance  The patient reports that she is UTD on colonoscopy, but has not has a PAP smear. Pt reports that she has had the shingles shot.     Past Medical History  Diagnosis Date  . Allergy   . Hypertension   . Arthritis   . Cataract   . GERD (gastroesophageal reflux disease)     Current Outpatient Prescriptions on File Prior to Visit  Medication Sig Dispense Refill  . amLODipine (NORVASC) 10 MG tablet Take 1 tablet (10 mg total) by mouth daily. 90 tablet 3  . amLODipine (NORVASC) 5 MG tablet Take 1 tablet (5 mg total) by mouth daily. 90 tablet 3  . diclofenac sodium (VOLTAREN) 1 % GEL Apply 4 g topically 4 (four) times daily. 100 g 1  . fexofenadine (ALLEGRA ALLERGY) 180 MG tablet Take 1 tablet (180 mg total) by mouth daily. 20 tablet 0  .  olopatadine (PATANOL) 0.1 % ophthalmic solution Place 1 drop into both eyes 2 (two) times daily. 5 mL 1  . polyethylene glycol (MIRALAX / GLYCOLAX) packet Take 17 g by mouth daily. 30 each 0  . Vitamin D, Ergocalciferol, (DRISDOL) 50000 UNITS CAPS capsule Take 1 capsule (50,000 Units total) by mouth every 30 (thirty) days. 3 capsule 0  . zoster vaccine live, PF, (ZOSTAVAX) [id number] UNT/0.65ML injection Inject 19,400 Units into the skin once. 1 each 0   No current facility-administered medications on file prior to visit.    Allergies  Allergen Reactions  . Penicillins     REACTION: allergy/hand swelling and itching       Review of Systems  Gastrointestinal: Negative for abdominal pain.  Genitourinary: Positive for flank pain.    BP 150/90 mmHg  Pulse 98  Temp(Src) 97.9 F (36.6 C) (Oral)  Resp 16  Ht 5\' 6"  (1.676 m)  Wt 210 lb 12.8 oz (95.618 kg)  BMI 34.04 kg/m2  SpO2 98%    BP Right 160/82;  BP Left 150/82;       Objective:   Physical Exam  Constitutional: She is oriented to person, place, and time. She appears well-developed and well-nourished. No distress.  HENT:  Head: Normocephalic and atraumatic.  Eyes: Conjunctivae and EOM are normal.  Neck: Neck supple.  Cardiovascular: Normal rate, regular rhythm and normal heart sounds.   Pulmonary/Chest: Effort normal and breath sounds normal. No respiratory distress.  Abdominal: Soft. She exhibits no mass. There is no CVA tenderness.  Tenderness over left lateral abdominal wall and flank area   Genitourinary: Rectal exam shows no mass. Right adnexum displays no mass. Left adnexum displays no mass.  Neurological: She is alert and oriented to person, place, and time.  Skin: Skin is warm.  Psychiatric: Her behavior is normal.  Nursing note and vitals reviewed. [facility] reading (PRIMARY) by  Yvonne Mahoney there are 2 tiny calcific densities in the right kidney. There are clips present in the right upper quadrant and left adnexal area. She  has mild to moderate degenerative changes of the lumbar spine with L5-S1 degenerative disc disease Previous EKG shows first-degree block otherwise unremarkable.    Results for orders placed or performed in visit on 09/19/14  POCT UA - Microscopic Only  Result Value Ref Range   WBC, Ur, HPF, POC 1-5    RBC, urine, microscopic 0-2    Bacteria, U Microscopic 3+    Mucus, UA large    Epithelial cells, urine per micros 5-10    Crystals, Ur, HPF, POC neg    Casts, Ur, LPF, POC neg    Yeast, UA neg    Amorphous small    Renal tubular cells positive   POCT urinalysis dipstick  Result Value Ref Range   Color, UA yellow    Clarity, UA hazy    Glucose, UA neg    Bilirubin, UA neg    Ketones, UA neg    Spec Grav, UA 1.020    Blood, UA neg    pH, UA 5.5    Protein, UA neg    Urobilinogen, UA 0.2    Nitrite, UA neg    Leukocytes, UA Negative   POCT CBC  Result Value Ref Range   WBC 10.2 4.6 - 10.2 K/uL   Lymph, poc 2.4 0.6 - 3.4   POC LYMPH PERCENT 23.6 10 - 50 %L   MID (cbc) 0.6 0 - 0.9   POC MID % 5.6 0 - 12 %M   POC Granulocyte 7.2 (A) 2 - 6.9   Granulocyte percent 70.8 37 - 80 %G   RBC 5.36 4.04 - 5.48 M/uL   Hemoglobin 13.9 12.2 - 16.2 g/dL   HCT, POC 96.043.1 45.437.7 - 47.9 %   MCV 80.4 80 - 97 fL   MCH, POC 25.9 (A) 27 - 31.2 pg   MCHC 32.3 31.8 - 35.4 g/dL   RDW, POC 09.815.2 %   Platelet Count, POC 249 142 - 424 K/uL   MPV 7.3 0 - 99.8 fL   Assessment & Plan:   Will add Toprol XL 50  one half tab for first week then 1 tab daily . She will alternate ice and heat for her side pain. Urine culture was done. She was given Robaxin as a muscle relaxer.RTC 9 days for bp check.I personally performed the services described in this documentation, which was scribed in my presence. The recorded information has been reviewed and is accurate. I wonder whether her discomfort could be related to her hyperparathyroidism.  Yvonne LitesSteve Semaj Coburn, MD

## 2014-09-20 LAB — URINE CULTURE
COLONY COUNT: NO GROWTH
ORGANISM ID, BACTERIA: NO GROWTH

## 2014-09-20 LAB — PTH, INTACT AND CALCIUM
Calcium: 11.1 mg/dL — ABNORMAL HIGH (ref 8.7–10.3)
PTH: 57 pg/mL (ref 15–65)

## 2014-09-24 ENCOUNTER — Ambulatory Visit: Payer: Medicare Other | Admitting: Endocrinology

## 2014-09-25 ENCOUNTER — Ambulatory Visit (INDEPENDENT_AMBULATORY_CARE_PROVIDER_SITE_OTHER): Payer: Medicare Other | Admitting: Endocrinology

## 2014-09-25 ENCOUNTER — Encounter: Payer: Self-pay | Admitting: Endocrinology

## 2014-09-25 VITALS — BP 124/82 | HR 77 | Temp 98.3°F | Resp 14 | Ht 66.0 in | Wt 211.2 lb

## 2014-09-25 DIAGNOSIS — E210 Primary hyperparathyroidism: Secondary | ICD-10-CM

## 2014-09-25 DIAGNOSIS — I10 Essential (primary) hypertension: Secondary | ICD-10-CM | POA: Diagnosis not present

## 2014-09-25 NOTE — Progress Notes (Signed)
Patient ID: Yvonne Mahoney, female   DOB: 1945/05/25, 69 y.o.   MRN: [id number]     Chief complaint: High calcium   History of Present Illness:  Referring physician: Lauenstein   Review of records show that she has had a high calcium since 05/2012:  Lab Results  Component Value Date   CALCIUM 11.1* 09/19/2014   CALCIUM 11.9* 07/20/2014   CALCIUM 11.6* 07/12/2014   CALCIUM 10.9* 05/25/2013   CALCIUM 11.4* 05/19/2012   CALCIUM 10.0 11/18/2007    The hypercalcemia is not associated with any history of pathologic fractures, renal insufficiency, kidney stones, sarcoidosis, known carcinoma or known hyperthyroidism.  Patient has no symptoms of bone pain, nausea, abdominal discomfort or unusual fatigue.  Recent history: Because of her relatively high calcium level her HCTZ was discontinued on her initial consultation in her calcium level appears to be improved now.  Also she was told to leave off her vitamin D by her PCP.  Not taking any calcium supplements   Prior serologic and radiologic studies have included:  [facility] 215  Lab Results  Component Value Date   PTH 57 09/19/2014   PTH Comment 09/19/2014   CALCIUM 11.1* 09/19/2014   Bone density: She thinks she had screening bone densities possibly of the heel about 3 years ago and this was reportedly normal, no record available  25 (OH) Vitamin D level was 23 in 05/2012 and she was started on 50,000 units of vitamin D weekly. In 2015 her level was 28 and she was told to take additional vitamin D 3, 1000 units daily  More recent vitamin D level in 4/16 is normal     Medication List       This list is accurate as of: 09/25/14  8:06 AM.  Always use your most recent med list.               amLODipine 5 MG tablet  Commonly known as:  NORVASC  Take 1 tablet (5 mg total) by mouth daily.     amLODipine 10 MG tablet  Commonly known as:  NORVASC  Take 1 tablet (10 mg total) by mouth daily.     BESIVANCE  0.6 % Susp  Generic drug:  Besifloxacin HCl     diclofenac sodium 1 % Gel  Commonly known as:  VOLTAREN  Apply 4 g topically 4 (four) times daily.     DUREZOL 0.05 % Emul  Generic drug:  Difluprednate     fexofenadine 180 MG tablet  Commonly known as:  ALLEGRA ALLERGY  Take 1 tablet (180 mg total) by mouth daily.     ILEVRO 0.3 % ophthalmic suspension  Generic drug:  nepafenac     methocarbamol 500 MG tablet  Commonly known as:  ROBAXIN  Take 1 tablet (500 mg total) by mouth 4 (four) times daily.     metoprolol succinate 50 MG 24 hr tablet  Commonly known as:  TOPROL-XL  Take one half tablet a day for the first week then 1 tablet daily     olopatadine 0.1 % ophthalmic solution  Commonly known as:  PATANOL  Place 1 drop into both eyes 2 (two) times daily.     polyethylene glycol packet  Commonly known as:  MIRALAX / GLYCOLAX  Take 17 g by mouth daily.     Vitamin D (Ergocalciferol) 50000 UNITS Caps capsule  Commonly known as:  DRISDOL  Take 1 capsule (50,000 Units total) by mouth every 30 (  thirty) days.     zoster vaccine live (PF) 19400 UNT/0.65ML injection  Commonly known as:  ZOSTAVAX  Inject 19,400 Units into the skin once.        Allergies:  Allergies  Allergen Reactions  . Penicillins     REACTION: allergy/hand swelling and itching    Past Medical History  Diagnosis Date  . Allergy   . Hypertension   . Arthritis   . Cataract   . GERD (gastroesophageal reflux disease)     Past Surgical History  Procedure Laterality Date  . Cholecystectomy    . Abdominal hysterectomy    . Bunionectomy  1986    both    Family History  Problem Relation Age of Onset  . Dementia Mother   . Hypertension Mother   . Cancer Father   . Cancer Sister   . Hyperlipidemia Sister   . Hypertension Sister   . Thyroid disease Maternal Aunt   . Diabetes Paternal Aunt     Social History:  reports that she has never smoked. She does not have any smokeless tobacco history  on file. She reports that she does not drink alcohol. Her drug history is not on file.  ROS  HYPERTENSION: Appears to be better with starting metoprolol in addition to her amlodipine She checks the blood pressure regularly at home and it has improved progressively to near-normal in the last 3 or 4 days   EXAM:  BP 124/82 mmHg  Pulse 77  Temp(Src) 98.3 F (36.8 C)  Resp 14  Ht 5\' 6"  (1.676 m)  Wt 211 lb 3.2 oz (95.8 kg)  BMI 34.10 kg/m2  SpO2 97%    Assessment:   HYPERCALCEMIA:  Patient appears to have hyperparathyroidism She had hypocalcemia since at least 2014 Currently the patient is asymptomatic Her calcium level is improved at 11.1 with leaving off her HCTZ However not clear if she has had any osteopenia, no recent bone densities available.  PLAN:   Since her calcium level is improved will continue to follow with observation alone Discussed that there is no specific medical treatment available for hyperparathyroidism  She will also have a bone density assessed and if it is significantly abnormal may need specific therapy for this Also will need periodic follow-up of vitamin D levels, most likely will need a maintenance dose of vitamin D 3 of about  1000 units daily  Follow-up in 3 months Continue follow-up with PCP for hypertension   Yvonne Mahoney 09/25/2014, 8:06 AM

## 2014-11-29 ENCOUNTER — Ambulatory Visit (INDEPENDENT_AMBULATORY_CARE_PROVIDER_SITE_OTHER)
Admission: RE | Admit: 2014-11-29 | Discharge: 2014-11-29 | Disposition: A | Discharge status: Home / Self Care | Payer: Medicare Other | Source: Ambulatory Visit | Attending: Endocrinology | Admitting: Endocrinology

## 2014-11-29 DIAGNOSIS — E210 Primary hyperparathyroidism: Secondary | ICD-10-CM

## 2014-12-11 NOTE — Progress Notes (Signed)
Quick Note:  Please let patient know that the bone density is normal except slightly low at right hip, not a concern  ______

## 2014-12-19 ENCOUNTER — Telehealth: Payer: Self-pay | Admitting: Endocrinology

## 2014-12-19 NOTE — Telephone Encounter (Signed)
I did not call her.

## 2014-12-19 NOTE — Telephone Encounter (Signed)
[facility] note dated 12/18/14 5:38 PM Initial Comment Caller says she missed a call from the office; does not know why. Advised to cb tomorrow

## 2014-12-21 ENCOUNTER — Other Ambulatory Visit (INDEPENDENT_AMBULATORY_CARE_PROVIDER_SITE_OTHER): Payer: Medicare Other

## 2014-12-21 ENCOUNTER — Other Ambulatory Visit: Payer: Medicare Other

## 2014-12-21 DIAGNOSIS — E210 Primary hyperparathyroidism: Secondary | ICD-10-CM | POA: Diagnosis not present

## 2014-12-21 LAB — BASIC METABOLIC PANEL
BUN: 13 mg/dL (ref 6–23)
CO2: 25 meq/L (ref 19–32)
Calcium: 10.3 mg/dL (ref 8.4–10.5)
Chloride: 104 mEq/L (ref 96–112)
Creatinine, Ser: 0.74 mg/dL (ref 0.40–1.20)
GFR: 99.39 mL/min (ref >60.00)
GLUCOSE: 118 mg/dL — AB (ref 70–99)
POTASSIUM: 4.3 meq/L (ref 3.5–5.1)
Sodium: 137 mEq/L (ref 135–145)

## 2014-12-26 ENCOUNTER — Ambulatory Visit (INDEPENDENT_AMBULATORY_CARE_PROVIDER_SITE_OTHER): Payer: Medicare Other | Admitting: Endocrinology

## 2014-12-26 ENCOUNTER — Encounter: Payer: Self-pay | Admitting: Endocrinology

## 2014-12-26 VITALS — BP 126/64 | HR 72 | Temp 98.2°F | Resp 14 | Ht 66.0 in | Wt 214.2 lb

## 2014-12-26 DIAGNOSIS — E210 Primary hyperparathyroidism: Secondary | ICD-10-CM | POA: Diagnosis not present

## 2014-12-26 DIAGNOSIS — R7301 Impaired fasting glucose: Secondary | ICD-10-CM | POA: Diagnosis not present

## 2014-12-26 DIAGNOSIS — I10 Essential (primary) hypertension: Secondary | ICD-10-CM | POA: Diagnosis not present

## 2014-12-26 NOTE — Progress Notes (Signed)
Patient ID: Yvonne Mahoney, female   DOB: 02/25/1946, 69 y.o.   MRN: [id number]     Chief complaint: High calcium   History of Present Illness:  Referring physician: Lauenstein   Review of records show that she has had a high calcium since 05/2012:  Lab Results  Component Value Date   CALCIUM 10.3 12/21/2014   CALCIUM 11.1* 09/19/2014   CALCIUM 11.9* 07/20/2014   CALCIUM 11.6* 07/12/2014   CALCIUM 10.9* 05/25/2013   CALCIUM 11.4* 05/19/2012   CALCIUM 10.0 11/18/2007   She has been asymptomatic from it The hypercalcemia is not associated with any history of pathologic fractures, osteoporosis, renal insufficiency, kidney stones, sarcoidosis, known carcinoma or known hyperthyroidism.   Recent history: Because of her relatively high calcium level her HCTZ was discontinued on her initial consultation in her calcium level appears to be improved further now.  Also she was told to leave off her vitamin D by her PCP, her last level was normal.  Not taking any calcium supplements  Prior serologic and radiologic studies have included:  PTH from [facility] lab was 215  Lab Results  Component Value Date   PTH 57 09/19/2014   PTH Comment 09/19/2014   CALCIUM 10.3 12/21/2014   Bone density: She had only minimal decrease with T score -1.4 at the right femoral neck but otherwise normal  25 ([place]) Vitamin D level was 23 in 05/2012 and she was started on 50,000 units of vitamin D weekly. In 2015 her level was 28 and she was told to take additional vitamin D 3, 1000 units daily  More recent vitamin D level in 4/16 is normal     Medication List       This list is accurate as of: 12/26/14 10:10 AM.  Always use your most recent med list.               amLODipine 10 MG tablet  Commonly known as:  NORVASC  Take 1 tablet (10 mg total) by mouth daily.     BESIVANCE 0.6 % Susp  Generic drug:  Besifloxacin HCl     diclofenac sodium 1 % Gel  Commonly known as:  VOLTAREN  Apply 4 g  topically 4 (four) times daily.     DUREZOL 0.05 % Emul  Generic drug:  Difluprednate     fexofenadine 180 MG tablet  Commonly known as:  ALLEGRA ALLERGY  Take 1 tablet (180 mg total) by mouth daily.     ILEVRO 0.3 % ophthalmic suspension  Generic drug:  nepafenac     methocarbamol 500 MG tablet  Commonly known as:  ROBAXIN  Take 1 tablet (500 mg total) by mouth 4 (four) times daily.     metoprolol succinate 50 MG 24 hr tablet  Commonly known as:  TOPROL-XL  Take one half tablet a day for the first week then 1 tablet daily     olopatadine 0.1 % ophthalmic solution  Commonly known as:  PATANOL  Place 1 drop into both eyes 2 (two) times daily.     polyethylene glycol packet  Commonly known as:  MIRALAX / GLYCOLAX  Take 17 g by mouth daily.     Vitamin D (Ergocalciferol) 50000 UNITS Caps capsule  Commonly known as:  DRISDOL  Take 1 capsule (50,000 Units total) by mouth every 30 (thirty) days.     zoster vaccine live (PF) 19400 UNT/0.65ML injection  Commonly known as:  ZOSTAVAX  Inject 19,400 Units into the skin once.  Allergies:  Allergies  Allergen Reactions  . Penicillins     REACTION: allergy/hand swelling and itching    Past Medical History  Diagnosis Date  . Allergy   . Hypertension   . Arthritis   . Cataract   . GERD (gastroesophageal reflux disease)     Past Surgical History  Procedure Laterality Date  . Cholecystectomy    . Abdominal hysterectomy    . Bunionectomy  1986    both    Family History  Problem Relation Age of Onset  . Dementia Mother   . Hypertension Mother   . Diabetes Mother   . Cancer Father   . Cancer Sister   . Hyperlipidemia Sister   . Hypertension Sister   . Thyroid disease Maternal Aunt   . Diabetes Paternal Aunt     Social History:  reports that she has never smoked. She does not have any smokeless tobacco history on file. She reports that she does not drink alcohol. Her drug history is not on  file.  ROS  HYPERTENSION: Appears to be better with starting metoprolol in addition to her amlodipine She checks the blood pressure regularly at home and it has improved progressively to near-normal in the last 3 or 4 days   EXAM:  BP 126/64 mmHg  Pulse 72  Temp(Src) 98.2 F (36.8 C)  Resp 14  Ht  (1.676 m)  Wt 214 lb 3.2 oz (97.16 kg)  BMI 34.59 kg/m2  SpO2 94%    Assessment:   HYPERCALCEMIA:  Patient has hyperparathyroidism She had variable degree of hypercalcemia since at least 2014 and is asymptomatic Has no effects on bone density Currently the patient is asymptomatic Her calcium level is improved at 10.3, not clear why her level has improved further  IMPAIRED fasting glucose: This has not been diagnosed before in her glucose is 118.  Discussed implications including cutting back on high calorie foods, simple sugars and starting exercise for weight loss  PLAN:   Since her calcium level is improved will continue to follow with observation alone Discussed that there is no specific medical treatment available for hyperparathyroidism  Also will need periodic follow-up of vitamin D levels, most likely will need a maintenance dose of vitamin D 3 of about  1000 units daily  Follow-up in 4 months  She will establish with a new PCP   Radhika Dershem 12/26/2014, 10:10 AM

## 2015-01-02 ENCOUNTER — Encounter (HOSPITAL_COMMUNITY): Payer: Self-pay | Admitting: *Deleted

## 2015-01-02 ENCOUNTER — Inpatient Hospital Stay (HOSPITAL_COMMUNITY)
Admission: AD | Admit: 2015-01-02 | Discharge: 2015-01-02 | Disposition: A | Discharge status: Home / Self Care | Payer: Medicare Other | Source: Ambulatory Visit | Attending: Obstetrics & Gynecology | Admitting: Obstetrics & Gynecology

## 2015-01-02 DIAGNOSIS — Z90710 Acquired absence of both cervix and uterus: Secondary | ICD-10-CM | POA: Diagnosis not present

## 2015-01-02 DIAGNOSIS — Z880 Allergy status to penicillin: Secondary | ICD-10-CM | POA: Insufficient documentation

## 2015-01-02 DIAGNOSIS — R1031 Right lower quadrant pain: Secondary | ICD-10-CM

## 2015-01-02 DIAGNOSIS — R1030 Lower abdominal pain, unspecified: Secondary | ICD-10-CM | POA: Diagnosis not present

## 2015-01-02 DIAGNOSIS — R1032 Left lower quadrant pain: Secondary | ICD-10-CM

## 2015-01-02 LAB — URINALYSIS, ROUTINE W REFLEX MICROSCOPIC
Bilirubin Urine: NEGATIVE
Glucose, UA: NEGATIVE mg/dL
Hgb urine dipstick: NEGATIVE
Ketones, ur: NEGATIVE mg/dL
NITRITE: NEGATIVE
PH: 6 (ref 5.0–8.0)
Protein, ur: NEGATIVE mg/dL
SPECIFIC GRAVITY, URINE: 1.015 (ref 1.005–1.030)
Urobilinogen, UA: 0.2 mg/dL (ref 0.0–1.0)

## 2015-01-02 LAB — URINE MICROSCOPIC-ADD ON

## 2015-01-02 MED ORDER — MELOXICAM 7.5 MG PO TABS: 7.5000 mg | ORAL_TABLET | Freq: Every day | ORAL | Status: DC

## 2015-01-02 MED ORDER — KETOROLAC TROMETHAMINE 30 MG/ML IJ SOLN
30.0000 mg | INTRAMUSCULAR | Status: AC
  Administered 2015-01-02: 30 mg via INTRAMUSCULAR
  Filled 2015-01-02: qty 1

## 2015-01-02 NOTE — MAU Provider Note (Signed)
History     CSN: [id number]  Arrival date and time: 01/02/15 1706   First Provider Initiated Contact with Patient 01/02/15 1957      Chief Complaint  Patient presents with  . Abdominal Pain  CC to provider is inguinal pain/waist pain HPI Yvonne Mahoney 69 y.o. nonpregnant female presents with inguinal pain.  She notes it started in left inguinal region a week ago and was noted on the right just today.  When she is at rest, it is not particularly painful.  But when she goes to raise her leg or walk, it hurts a great deal.  She was worried it was adhesions from her hysterectomy in 1989.  She had similar pain 6 months ago with constipation but she is not presently constipated.  She has been previously diagnosed with arthritis.  She has taken Robaxin prn but it has not helped.  She has not been seen by ortho and she does not have active pcp.  She denies SOB, CP, numbness, weakness, fever, tinging/radicular symptoms.  OB History    No data available      Past Medical History  Diagnosis Date  . Allergy   . Hypertension   . Arthritis   . Cataract   . GERD (gastroesophageal reflux disease)     Past Surgical History  Procedure Laterality Date  . Cholecystectomy    . Abdominal hysterectomy    . Bunionectomy  1986    both    Family History  Problem Relation Age of Onset  . Dementia Mother   . Hypertension Mother   . Diabetes Mother   . Cancer Father   . Cancer Sister   . Hyperlipidemia Sister   . Hypertension Sister   . Thyroid disease Maternal Aunt   . Diabetes Paternal Aunt     Social History  Substance Use Topics  . Smoking status: Never Smoker   . Smokeless tobacco: None  . Alcohol Use: No    Allergies:  Allergies  Allergen Reactions  . Penicillins     REACTION: allergy/hand swelling and itching    Prescriptions prior to admission  Medication Sig Dispense Refill Last Dose  . amLODipine (NORVASC) 10 MG tablet Take 1 tablet (10 mg total) by mouth daily.  90 tablet 3 01/02/2015 at Unknown time  . methocarbamol (ROBAXIN) 500 MG tablet Take 1 tablet (500 mg total) by mouth 4 (four) times daily. 30 tablet 0 01/02/2015 at Unknown time  . metoprolol succinate (TOPROL-XL) 50 MG 24 hr tablet Take one half tablet a day for the first week then 1 tablet daily 30 tablet 11 01/02/2015 at Unknown time  . BESIVANCE 0.6 % SUSP    Taking  . diclofenac sodium (VOLTAREN) 1 % GEL Apply 4 g topically 4 (four) times daily. 100 g 1 Taking  . DUREZOL 0.05 % EMUL   1 Taking  . fexofenadine (ALLEGRA ALLERGY) 180 MG tablet Take 1 tablet (180 mg total) by mouth daily. 20 tablet 0 Taking  . ILEVRO 0.3 % ophthalmic suspension   1 Taking  . olopatadine (PATANOL) 0.1 % ophthalmic solution Place 1 drop into both eyes 2 (two) times daily. 5 mL 1 Taking  . polyethylene glycol (MIRALAX / GLYCOLAX) packet Take 17 g by mouth daily. 30 each 0 Taking  . Vitamin D, Ergocalciferol, (DRISDOL) 50000 UNITS CAPS capsule Take 1 capsule (50,000 Units total) by mouth every 30 (thirty) days. 3 capsule 0 Taking  . zoster vaccine live, PF, (ZOSTAVAX) 40981 UNT/0.65ML injection  Inject 19,400 Units into the skin once. 1 each 0 Taking    ROS Pertinent ROS in HPI.  All other systems are negative.   Physical Exam   Blood pressure 121/61, pulse 78, temperature 98.7 F (37.1 C), temperature source Oral, resp. rate 18, SpO2 99 %.  Physical Exam  Constitutional: She is oriented to person, place, and time. She appears well-developed and well-nourished. No distress.  HENT:  Head: Normocephalic and atraumatic.  Eyes: EOM are normal.  Neck: Normal range of motion.  Cardiovascular: Normal rate and normal heart sounds.   Respiratory: Effort normal. No respiratory distress.  GI: Soft. Bowel sounds are normal. She exhibits no distension. There is no tenderness. There is no rebound and no guarding.  Genitourinary:  No inguinal lymphadenopathy.  Musculoskeletal:  Significant pain with even slight active  movement of bilat lower extremities.  Able to move passively without pain for inward and outward rotation of hip.  No tenderness to palpation over the trochanteric bursas bilaterally.    Neurological: She is alert and oriented to person, place, and time. No cranial nerve deficit.  Skin: Skin is warm and dry.  Psychiatric: She has a normal mood and affect.    MAU Course  Procedures  MDM Discussed with Dr. Despina Hidden.  Will give  Toradol IM to help with pain management acutely and send with Rx for Mobic until pt able to see PCP/ortho.    Assessment and Plan  A:  1. Inguinal pain of both sides      P: Discharge to home Rx Mobic 7.5mg .  1 po qd.  May use max of 2/day See PCP or ortho asap for additional eval If symptoms worsen, pt advised to go to [facility] ED.     Bertram Denver 01/02/2015, 8:43 PM

## 2015-01-02 NOTE — Discharge Instructions (Signed)
Hip Pain Your hip is the joint between your upper legs and your lower pelvis. The bones, cartilage, tendons, and muscles of your hip joint perform a lot of work each day supporting your body weight and allowing you to move around. Hip pain can range from a minor ache to severe pain in one or both of your hips. Pain may be felt on the inside of the hip joint near the groin, or the outside near the buttocks and upper thigh. You may have swelling or stiffness as well.  HOME CARE INSTRUCTIONS   Take medicines only as directed by your health care provider.  Apply ice to the injured area:  Put ice in a plastic bag.  Place a towel between your skin and the bag.  Leave the ice on for 15-20 minutes at a time, 3-4 times a day.  Keep your leg raised (elevated) when possible to lessen swelling.  Avoid activities that cause pain.  Follow specific exercises as directed by your health care provider.  Sleep with a pillow between your legs on your most comfortable side.  Record how often you have hip pain, the location of the pain, and what it feels like. SEEK MEDICAL CARE IF:   You are unable to put weight on your leg.  Your hip is red or swollen or very tender to touch.  Your pain or swelling continues or worsens after 1 week.  You have increasing difficulty walking.  You have a fever. SEEK IMMEDIATE MEDICAL CARE IF:   You have fallen.  You have a sudden increase in pain and swelling in your hip. MAKE SURE YOU:   Understand these instructions.  Will watch your condition.  Will get help right away if you are not doing well or get worse. Document Released: 10/15/2009 Document Revised: 09/11/2013 Document Reviewed: 12/22/2012 Exit[employer] Patient Information 2015 [employer]. This information is not intended to replace advice given to you by your health care provider. Make sure you discuss any questions you have with your health care provider.  

## 2015-01-02 NOTE — MAU Note (Addendum)
abd pain, in groin,- in the "corners" bilaterally.  Started over a wk ago.  Feels like it did last fall, but worse.  At that time she was severely constipated.  She states she has been going regularly.  Hurts to sit, to bend over. Currently in wc - hurts to walk

## 2015-01-21 ENCOUNTER — Other Ambulatory Visit: Payer: Self-pay | Admitting: Family Medicine

## 2015-01-21 DIAGNOSIS — R109 Unspecified abdominal pain: Secondary | ICD-10-CM

## 2015-01-23 ENCOUNTER — Ambulatory Visit
Admission: RE | Admit: 2015-01-23 | Discharge: 2015-01-23 | Disposition: A | Discharge status: Home / Self Care | Payer: Medicare Other | Source: Ambulatory Visit | Attending: Family Medicine | Admitting: Family Medicine

## 2015-01-23 DIAGNOSIS — R109 Unspecified abdominal pain: Secondary | ICD-10-CM

## 2015-04-23 ENCOUNTER — Other Ambulatory Visit (INDEPENDENT_AMBULATORY_CARE_PROVIDER_SITE_OTHER): Payer: Medicare Other

## 2015-04-23 DIAGNOSIS — R7301 Impaired fasting glucose: Secondary | ICD-10-CM

## 2015-04-23 DIAGNOSIS — E210 Primary hyperparathyroidism: Secondary | ICD-10-CM

## 2015-04-23 LAB — RENAL FUNCTION PANEL
ALBUMIN: 3.9 g/dL (ref 3.5–5.2)
BUN: 12 mg/dL (ref 6–23)
CHLORIDE: 105 meq/L (ref 96–112)
CO2: 29 meq/L (ref 19–32)
Calcium: 10.7 mg/dL — ABNORMAL HIGH (ref 8.4–10.5)
Creatinine, Ser: 0.72 mg/dL (ref 0.40–1.20)
GFR: 102.49 mL/min (ref >60.00)
Glucose, Bld: 116 mg/dL — ABNORMAL HIGH (ref 70–99)
Phosphorus: 3.1 mg/dL (ref 2.3–4.6)
Potassium: 4.5 mEq/L (ref 3.5–5.1)
Sodium: 138 mEq/L (ref 135–145)

## 2015-04-23 LAB — HEMOGLOBIN A1C: Hgb A1c MFr Bld: 6.3 % (ref 4.6–6.5)

## 2015-04-26 ENCOUNTER — Encounter: Payer: Self-pay | Admitting: Endocrinology

## 2015-04-26 ENCOUNTER — Ambulatory Visit (INDEPENDENT_AMBULATORY_CARE_PROVIDER_SITE_OTHER): Payer: Medicare Other | Admitting: Endocrinology

## 2015-04-26 VITALS — BP 124/76 | HR 73 | Temp 98.5°F | Resp 14 | Ht 66.0 in | Wt 213.4 lb

## 2015-04-26 DIAGNOSIS — R7303 Prediabetes: Secondary | ICD-10-CM

## 2015-04-26 DIAGNOSIS — E210 Primary hyperparathyroidism: Secondary | ICD-10-CM

## 2015-04-26 NOTE — Progress Notes (Signed)
Patient ID: Yvonne Mahoney, female   DOB: 1945/09/02, 69 y.o.   MRN: [id number]     Chief complaint: High calcium   History of Present Illness:   Review of records show that she has had a high calcium since 05/2012:  Lab Results  Component Value Date   CALCIUM 10.7* 04/23/2015   CALCIUM 10.3 12/21/2014   CALCIUM 11.1* 09/19/2014   CALCIUM 11.9* 07/20/2014   CALCIUM 11.6* 07/12/2014   CALCIUM 10.9* 05/25/2013   CALCIUM 11.4* 05/19/2012   CALCIUM 10.0 11/18/2007   She has been asymptomatic from it The hypercalcemia is not associated with any history of pathologic fractures, osteoporosis, renal insufficiency, kidney stones, sarcoidosis, known carcinoma or known hyperthyroidism.   Recent history: Because of her relatively high calcium level her HCTZ was discontinued on her initial consultation in her calcium level appears to be improved further now.     Not taking any calcium supplements  Prior serologic and radiologic studies have included:  [facility]  Lab Results  Component Value Date   PTH 57 09/19/2014   PTH Comment 09/19/2014   CALCIUM 10.7* 04/23/2015   PHOS 3.1 04/23/2015   Bone density: She had only minimal decrease with T score -1.4 at the right femoral neck but otherwise normal  25 (OH) Vitamin D level was 23 in 05/2012 and she was started on 50,000 units of vitamin D weekly. Her PCP had stopped her vitamin Dwhen she had hypercalcemia  More recent vitamin D level in 4/16 is normal   IMPAIRED fasting glucose: See review of systems    Medication List       This list is accurate as of: 04/26/15 10:16 AM.  Always use your most recent med list.               amLODipine 10 MG tablet  Commonly known as:  NORVASC  Take 1 tablet (10 mg total) by mouth daily.     BESIVANCE 0.6 % Susp  Generic drug:  Besifloxacin HCl  Reported on 04/26/2015     diclofenac sodium 1 % Gel  Commonly known as:  VOLTAREN  Apply 4 g topically 4  (four) times daily.     DUREZOL 0.05 % Emul  Generic drug:  Difluprednate     fexofenadine 180 MG tablet  Commonly known as:  ALLEGRA ALLERGY  Take 1 tablet (180 mg total) by mouth daily.     FLUZONE HIGH-DOSE 0.5 ML Susy  Generic drug:  Influenza Vac Split High-Dose  ADM 0.5ML IM UTD     ILEVRO 0.3 % ophthalmic suspension  Generic drug:  nepafenac     meloxicam 7.5 MG tablet  Commonly known as:  MOBIC  Take 1 tablet (7.5 mg total) by mouth daily.     methocarbamol 500 MG tablet  Commonly known as:  ROBAXIN  Take 1 tablet (500 mg total) by mouth 4 (four) times daily.     metoprolol succinate 50 MG 24 hr tablet  Commonly known as:  TOPROL-XL  Take one half tablet a day for the first week then 1 tablet daily     olopatadine 0.1 % ophthalmic solution  Commonly known as:  PATANOL  Place 1 drop into both eyes 2 (two) times daily.     polyethylene glycol packet  Commonly known as:  MIRALAX / GLYCOLAX  Take 17 g by mouth daily.     zoster vaccine live (PF) 19400 UNT/0.65ML injection  Commonly known as:  ZOSTAVAX  Inject 19,400 Units into the skin once.        Allergies:  Allergies  Allergen Reactions  . Penicillins     REACTION: allergy/hand swelling and itching    Past Medical History  Diagnosis Date  . Allergy   . Hypertension   . Arthritis   . Cataract   . GERD (gastroesophageal reflux disease)     Past Surgical History  Procedure Laterality Date  . Cholecystectomy    . Abdominal hysterectomy    . Bunionectomy  1986    both    Family History  Problem Relation Age of Onset  . Dementia Mother   . Hypertension Mother   . Diabetes Mother   . Cancer Father   . Cancer Sister   . Hyperlipidemia Sister   . Hypertension Sister   . Thyroid disease Maternal Aunt   . Diabetes Paternal Aunt     Social History:  reports that she has never smoked. She does not have any smokeless tobacco history on file. She reports that she does not drink alcohol or use  illicit drugs.  ROS  HYPERTENSION: Using metoprolol in addition to her amlodipine She checks the blood pressure regularly at home:125/70-75    IMPAIRED fasting glucose: Her glucose was 118 previously and now 116. She has difficulty losing weight and does not think she can control her appetite well Not exercising at all  Lab Results  Component Value Date   HGBA1C 6.3 04/23/2015   Lab Results  Component Value Date   LDLCALC 166* 07/12/2014   CREATININE 0.72 04/23/2015      EXAM:  BP 124/76 mmHg  Pulse 73  Temp(Src) 98.5 F (36.9 C)  Resp 14  Ht  (1.676 m)  Wt 213 lb 6.4 oz (96.798 kg)  BMI 34.46 kg/m2  SpO2 97%    Assessment:   HYPERCALCEMIA:  Patient has mild asymptomatic hyperparathyroidism She had variable degree of hypercalcemia since at least 2014 and is asymptomatic Has no adverse effects on bone density Her calcium level is staying below 11, now 10.7 and has been fluctuating  IMPAIRED fasting glucose:  She was recommended seeing the dietitian to help with weight loss and modifying her diet as she is unable to do this on her own Explained the nature of impaired fasting glucose on pre-diabetes and need for lifestyle changes Since her A1c is 6.3 week and hold off on metformin for now  PLAN:   Since her calcium level is fairly good will continue to follow with observation alone  Also will need periodic follow-up of vitamin D levels,  will need a maintenance dose of vitamin D 3 of about  1000 units daily as her levels had been below 30 previously  Consultation with dietitian for prediabetes and she will start home video exercise program to lose weight  Follow-up in 6 months  She will establish with a new PCP   Ajeet Casasola 04/26/2015, 10:16 AM

## 2015-04-26 NOTE — Patient Instructions (Signed)
Vitamin D3, 1000 daily  Exercise daily 

## 2015-06-06 ENCOUNTER — Encounter: Payer: Self-pay | Admitting: Dietician

## 2015-06-06 ENCOUNTER — Encounter: Payer: Medicare Other | Attending: Endocrinology | Admitting: Dietician

## 2015-06-06 VITALS — Ht 64.0 in | Wt 215.0 lb

## 2015-06-06 DIAGNOSIS — R7303 Prediabetes: Secondary | ICD-10-CM

## 2015-06-06 DIAGNOSIS — Z713 Dietary counseling and surveillance: Secondary | ICD-10-CM | POA: Diagnosis not present

## 2015-06-06 NOTE — Patient Instructions (Signed)
Rethink what you drink Resume your exercise habit (walking, water exercise, dancing, exercise video) Avoid eating in front of the TV or computer. Before eating a snack ask, "Am I hungry or eating for another reason?" Stop eating when satisfied or full. Eat breakfast, lunch, and dinner daily. Aim for 2-3 Carb Choices per meal (30-45 grams) +/- 1 either way  Aim for 0-1 Carbs per snack if hungry  Include protein in moderation with your meals and snacks Consider reading food labels for Total Carbohydrate and Fat Grams of foods

## 2015-06-06 NOTE — Progress Notes (Signed)
Medical Nutrition Therapy:  Appt start time: 0945 end time:  1045.   Assessment:  Primary concerns today: Patient is here alone.  She would like to learn about nutrition and states that she has recently been told about Prediabetes.  HgbA1C 6.3% 04/22/16.  Other hx includes HTN, GERD, and low vitamin D.  She is a patient of Dr. Lucianne Muss.  Patient lives alone.  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  She does her own shopping and cooking and occasionally eats out.  Lately she has not done any exercise although she enjoys it and states that she has a lot of energy.   When she worked, she would have hot tea with sugar and milk for breakfast and Mr. [employer]io [employer] potato chips, and a regular coke for [employer].  Since retiring she has been eating better, has less fried foods and avoids buying the coke and other things that are more of a temptation to her.  She states that eating sugar makes her crave food.  She has been on multiple fad diets in the past.  Preferred Learning Style:   No preference indicated   Learning Readiness:   Ready  Change in progress   MEDICATIONS: see list to include Biotin   DIETARY INTAKE: Doesn't always eat breakfast.  "Kind of addicted to Coke."  Does not eat a lot of fruit.  She reports often snacking because she is bored. Usual eating pattern includes 2-3 meals and 1-3 snacks per day.  24-hr recall:  B ( AM):  Warm Chocolate milk OR fried egg and 1 slice sausage or bacon Snk ( AM): This week cheese doodles and pork skins.  L ( PM): pork or salmon, mixed vegetables [facility]) OR out to eat with double slaw, mixed vegetables, grilled or fried fish Snk ( PM): This week cheese doodles and pork skins. D ( PM): Salad with craisins, tuna OR Steak restaurant and orders fish Snk ( PM): This week cheese doodles and pork skins. Beverages: hot tea with 1-2 tsp sugar, sweet tea (1 cup sugar per gallon) , chocolate milk, water  Usual  physical activity: "Used to walk"  Estimated energy needs: 1500 calories 170 g carbohydrates 94 g protein 50 g fat  Progress Towards Goal(s):  In progress.   Nutritional Diagnosis:  NB-1.1 Food and nutrition-related knowledge deficit As related to balance of carbohydrates, protein, and fat.  As evidenced by diet hx and patient report.    Intervention:  Nutrition counseling and diabetes education initiated. Discussed Carb Counting by food group as method of portion control, reading food labels, and benefits of increased activity. Also discussed basic physiology of Prediabetes/Diabetes. Discussed mindful eating. . Rethink what you drink Resume your exercise habit (walking, water exercise, dancing, exercise video) Avoid eating in front of the TV or computer. Before eating a snack ask, "Am I hungry or eating for another reason?" Stop eating when satisfied or full. Eat breakfast, lunch, and dinner daily. Aim for 2-3 Carb Choices per meal (30-45 grams) +/- 1 either way  Aim for 0-1 Carbs per snack if hungry  Include protein in moderation with your meals and snacks Consider reading food labels for Total Carbohydrate and Fat Grams of foods  Teaching Method Utilized:  Visual Auditory Hands on  Handouts given during visit include: Living Well with Diabetes Carb Counting and Food Label handouts Meal Plan Card Snack list Label reading  Barriers to learning/adherence to lifestyle change: none  Demonstrated  degree of understanding via:  Teach Back   Monitoring/Evaluation:  Dietary intake, exercise, label reading, and body weight prn.  

## 2015-07-15 ENCOUNTER — Other Ambulatory Visit: Payer: Self-pay

## 2015-07-15 DIAGNOSIS — Z1231 Encounter for screening mammogram for malignant neoplasm of breast: Secondary | ICD-10-CM

## 2015-07-18 ENCOUNTER — Encounter: Payer: Medicare Other | Admitting: Family Medicine

## 2015-07-30 ENCOUNTER — Ambulatory Visit
Admission: RE | Admit: 2015-07-30 | Discharge: 2015-07-30 | Disposition: A | Discharge status: Home / Self Care | Payer: Medicare Other | Source: Ambulatory Visit

## 2015-07-30 DIAGNOSIS — Z1231 Encounter for screening mammogram for malignant neoplasm of breast: Secondary | ICD-10-CM

## 2015-08-20 ENCOUNTER — Other Ambulatory Visit: Payer: Self-pay | Admitting: Emergency Medicine

## 2015-09-24 ENCOUNTER — Telehealth: Payer: Self-pay | Admitting: Family Medicine

## 2015-09-24 NOTE — Telephone Encounter (Signed)
Tried to leave a message for patient to schedule CPE but was unable to leave message.

## 2015-10-22 ENCOUNTER — Other Ambulatory Visit (INDEPENDENT_AMBULATORY_CARE_PROVIDER_SITE_OTHER): Payer: Medicare Other

## 2015-10-22 DIAGNOSIS — R7303 Prediabetes: Secondary | ICD-10-CM | POA: Diagnosis not present

## 2015-10-22 DIAGNOSIS — E210 Primary hyperparathyroidism: Secondary | ICD-10-CM | POA: Diagnosis not present

## 2015-10-22 LAB — HEMOGLOBIN A1C: HEMOGLOBIN A1C: 6.2 % (ref 4.6–6.5)

## 2015-10-22 LAB — BASIC METABOLIC PANEL
BUN: 16 mg/dL (ref 6–23)
CO2: 27 mEq/L (ref 19–32)
Calcium: 10.5 mg/dL (ref 8.4–10.5)
Chloride: 105 mEq/L (ref 96–112)
Creatinine, Ser: 0.78 mg/dL (ref 0.40–1.20)
GFR: 93.31 mL/min (ref >60.00)
Glucose, Bld: 107 mg/dL — ABNORMAL HIGH (ref 70–99)
POTASSIUM: 4.6 meq/L (ref 3.5–5.1)
SODIUM: 136 meq/L (ref 135–145)

## 2015-10-25 ENCOUNTER — Ambulatory Visit (INDEPENDENT_AMBULATORY_CARE_PROVIDER_SITE_OTHER): Payer: Medicare Other | Admitting: Endocrinology

## 2015-10-25 ENCOUNTER — Encounter: Payer: Self-pay | Admitting: Endocrinology

## 2015-10-25 VITALS — BP 122/64 | HR 76 | Ht 66.0 in | Wt 212.0 lb

## 2015-10-25 DIAGNOSIS — R7303 Prediabetes: Secondary | ICD-10-CM | POA: Diagnosis not present

## 2015-10-25 DIAGNOSIS — E559 Vitamin D deficiency, unspecified: Secondary | ICD-10-CM | POA: Diagnosis not present

## 2015-10-25 DIAGNOSIS — E210 Primary hyperparathyroidism: Secondary | ICD-10-CM | POA: Diagnosis not present

## 2015-10-25 NOTE — Patient Instructions (Signed)
Vitamin D3, 1000 units daily  Exercise daily

## 2015-10-25 NOTE — Progress Notes (Signed)
Patient ID: Yvonne Mahoney, female   DOB: February 24, 1946, 70 y.o.   MRN: [id number]     Chief complaint: High calcium   History of Present Illness:   Review of records show that she has had a high calcium since 05/2012:  Lab Results  Component Value Date   CALCIUM 10.5 10/22/2015   CALCIUM 10.7* 04/23/2015   CALCIUM 10.3 12/21/2014   CALCIUM 11.1* 09/19/2014   CALCIUM 11.9* 07/20/2014   CALCIUM 11.6* 07/12/2014   CALCIUM 10.9* 05/25/2013   CALCIUM 11.4* 05/19/2012   CALCIUM 10.0 11/18/2007   She has been asymptomatic from it The hypercalcemia is not associated with any history of pathologic fractures, osteoporosis, renal insufficiency, kidney stones, sarcoidosis, known carcinoma or known hyperthyroidism.   Recent history: Because of her relatively high calcium level her HCTZ was discontinued on her initial consultation  Subsequently her calcium level appears to be improved and upper normal now.     Not taking any calcium supplements are vitamin D  Prior serologic and radiologic studies have included:  [facility]  Lab Results  Component Value Date   PTH 57 09/19/2014   PTH Comment 09/19/2014   CALCIUM 10.5 10/22/2015   PHOS 3.1 04/23/2015   Bone density: This was done in 7/16.  She had only minimal decrease with T score -1.4 at the right femoral neck but otherwise normal  25 (OH) Vitamin D level was 23 in 05/2012 and she was started on 50,000 units of vitamin D weekly. Her PCP had stopped her vitamin D when she had hypercalcemia   Last vitamin D level in 4/16 is normal   IMPAIRED fasting glucose: See review of systems    Medication List       This list is accurate as of: 10/25/15  8:18 AM.  Always use your most recent med list.               amLODipine 10 MG tablet  Commonly known as:  NORVASC  Take 1 tablet (10 mg total) by mouth daily.     BESIVANCE 0.6 % Susp  Generic drug:  Besifloxacin HCl  Reported on 10/25/2015     diclofenac sodium 1 % Gel  Commonly known as:  VOLTAREN  Apply 4 g topically 4 (four) times daily.     DUREZOL 0.05 % Emul  Generic drug:  Difluprednate  Reported on 10/25/2015     fexofenadine 180 MG tablet  Commonly known as:  ALLEGRA ALLERGY  Take 1 tablet (180 mg total) by mouth daily.     ILEVRO 0.3 % ophthalmic suspension  Generic drug:  nepafenac  Reported on 06/06/2015     metoprolol succinate 50 MG 24 hr tablet  Commonly known as:  TOPROL-XL  TAKE ONE-HALF TABLET BY MOUTH ONCE DAILY FOR 7 DAYS, THEN ONE TAB DAILY     olopatadine 0.1 % ophthalmic solution  Commonly known as:  PATANOL  Place 1 drop into both eyes 2 (two) times daily.     polyethylene glycol packet  Commonly known as:  MIRALAX / GLYCOLAX  Take 17 g by mouth daily.        Allergies:  Allergies  Allergen Reactions  . Penicillins     REACTION: allergy/hand swelling and itching    Past Medical History  Diagnosis Date  . Allergy   . Hypertension   . Arthritis   . Cataract   . GERD (gastroesophageal reflux disease)   . Prediabetes  Past Surgical History  Procedure Laterality Date  . Cholecystectomy    . Abdominal hysterectomy    . Bunionectomy  1986    both    Family History  Problem Relation Age of Onset  . Dementia Mother   . Hypertension Mother   . Diabetes Mother   . Cancer Father   . Cancer Sister   . Hyperlipidemia Sister   . Hypertension Sister   . Thyroid disease Maternal Aunt   . Diabetes Paternal Aunt     Social History:  reports that she has never smoked. She does not have any smokeless tobacco history on file. She reports that she does not drink alcohol or use illicit drugs.  ROS  HYPERTENSION: Using metoprolol in addition to her amlodipine She checks the blood pressure regularly at home  WEIGHT history:  Wt Readings from Last 3 Encounters:  10/25/15 212 lb (96.163 kg)  06/06/15 215 lb (97.523 kg)  04/26/15 213 lb 6.4 oz (96.798 kg)    IMPAIRED fasting  glucose: Her glucose was 116 fasting previously and now 107. She has difficulty losing weight and does tend to snack She is trying to cut back on carbohydrates and fried food Has lost some weight but has gained back some Not exercising regularly despite advice  A1c is upper normal and stable   Lab Results  Component Value Date   HGBA1C 6.2 10/22/2015   HGBA1C 6.3 04/23/2015   Lab Results  Component Value Date   LDLCALC 166* 07/12/2014   CREATININE 0.78 10/22/2015     EXAM:  BP 122/64 mmHg  Pulse 76  Ht  (1.676 m)  Wt 212 lb (96.163 kg)  BMI 34.23 kg/m2  SpO2 96%    Assessment:   HYPERCALCEMIA:  Patient has mild asymptomatic hyperparathyroidism She had variable degree of hypercalcemia since at least 2014 and is asymptomatic Has no adverse effects on bone density Her calcium level is staying below 11, now upper normal at 10.5  IMPAIRED fasting glucose:  She was recommended starting an exercise program even at home Since her A1c is stable and fasting glucose is slightly lower no need to start metformin yet  PLAN:   Since her calcium level is fairly good will continue to follow with observation alone  Also will need maintenance vitamin D supplement of 1000 units and her level checked on the next visit  Exercise and diet for further weight loss  Follow-up in 6 months    Herold Salguero 10/25/2015, 8:18 AM

## 2015-10-28 ENCOUNTER — Other Ambulatory Visit: Payer: Self-pay | Admitting: Emergency Medicine

## 2015-10-30 ENCOUNTER — Telehealth: Payer: Self-pay | Admitting: *Deleted

## 2015-10-30 ENCOUNTER — Other Ambulatory Visit: Payer: Self-pay | Admitting: Emergency Medicine

## 2015-10-30 NOTE — Telephone Encounter (Signed)
What is f/up plan. Pt hasn't been seen since 09/2014.

## 2015-10-30 NOTE — Telephone Encounter (Signed)
Spoke with patient she has changed Primary care providers.

## 2015-11-27 ENCOUNTER — Other Ambulatory Visit: Payer: Self-pay

## 2015-11-27 NOTE — Telephone Encounter (Signed)
AmLodipine Besylate 10mg  #90 - fax req - denied - Pt changed PCP per prior notes

## 2016-04-22 ENCOUNTER — Other Ambulatory Visit: Payer: Medicare Other

## 2016-04-27 ENCOUNTER — Ambulatory Visit (INDEPENDENT_AMBULATORY_CARE_PROVIDER_SITE_OTHER): Payer: Medicare Other | Admitting: Endocrinology

## 2016-04-27 ENCOUNTER — Encounter: Payer: Self-pay | Admitting: Endocrinology

## 2016-04-27 DIAGNOSIS — E210 Primary hyperparathyroidism: Secondary | ICD-10-CM | POA: Diagnosis not present

## 2016-04-27 DIAGNOSIS — R7303 Prediabetes: Secondary | ICD-10-CM | POA: Diagnosis not present

## 2016-04-27 LAB — LIPID PANEL
CHOL/HDL RATIO: 5
CHOLESTEROL: 206 mg/dL — AB (ref 0–200)
HDL: 39.2 mg/dL (ref >39.00)
LDL Cholesterol: 134 mg/dL — ABNORMAL HIGH (ref 0–99)
NonHDL: 166.6
TRIGLYCERIDES: 164 mg/dL — AB (ref 0.0–149.0)
VLDL: 32.8 mg/dL (ref 0.0–40.0)

## 2016-04-27 LAB — COMPREHENSIVE METABOLIC PANEL
ALT: 24 U/L (ref 0–35)
AST: 22 U/L (ref 0–37)
Albumin: 4 g/dL (ref 3.5–5.2)
Alkaline Phosphatase: 74 U/L (ref 39–117)
BILIRUBIN TOTAL: 0.3 mg/dL (ref 0.2–1.2)
BUN: 10 mg/dL (ref 6–23)
CALCIUM: 10.6 mg/dL — AB (ref 8.4–10.5)
CO2: 27 meq/L (ref 19–32)
Chloride: 102 mEq/L (ref 96–112)
Creatinine, Ser: 0.67 mg/dL (ref 0.40–1.20)
GFR: 111.04 mL/min (ref >60.00)
Glucose, Bld: 100 mg/dL — ABNORMAL HIGH (ref 70–99)
Potassium: 4.1 mEq/L (ref 3.5–5.1)
Sodium: 136 mEq/L (ref 135–145)
Total Protein: 7.1 g/dL (ref 6.0–8.3)

## 2016-04-27 LAB — HEMOGLOBIN A1C: Hgb A1c MFr Bld: 6.5 % (ref 4.6–6.5)

## 2016-04-27 LAB — VITAMIN D 25 HYDROXY (VIT D DEFICIENCY, FRACTURES): VITD: 18.05 ng/mL — ABNORMAL LOW (ref 30.00–100.00)

## 2016-04-27 NOTE — Progress Notes (Signed)
Patient ID: Yvonne Mahoney, female   DOB: July 08, 1945, 70 y.o.   MRN: [id number]     Chief complaint: High calcium   History of Present Illness:   Review of records show that she has had a high calcium since 05/2012:  Lab Results  Component Value Date   CALCIUM 10.5 10/22/2015   CALCIUM 10.7 (H) 04/23/2015   CALCIUM 10.3 12/21/2014   CALCIUM 11.1 (H) 09/19/2014   CALCIUM 11.9 (H) 07/20/2014   CALCIUM 11.6 (H) 07/12/2014   CALCIUM 10.9 (H) 05/25/2013   CALCIUM 11.4 (H) 05/19/2012   CALCIUM 10.0 11/18/2007   She has been asymptomatic from it The hypercalcemia is not associated with any history of pathologic fractures, osteoporosis, renal insufficiency, kidney stones, sarcoidosis, known carcinoma or known hyperthyroidism.   Recent history: Because of her relatively high calcium level her HCTZ was discontinued on her initial consultation  Subsequently her calcium level appears to be improved and upper normal Consistently.     Not taking any calcium supplements She has been taking vitamin D 3, 1000 units daily  Prior serologic and radiologic studies:  25 (OH) Vitamin D level was 23 in 05/2012   Last vitamin D level in 4/16 is normal  [facility] 215 prior to her initial visit  Lab Results  Component Value Date   PTH 57 09/19/2014   PTH Comment 09/19/2014   CALCIUM 10.5 10/22/2015   PHOS 3.1 04/23/2015    Lab Results  Component Value Date   VD25OH 46.0 08/13/2014   VD25OH 28 (L) 05/25/2013    Bone density:  This was done in 7/16.  She had only minimal decrease with T score -1.4 at the right femoral neck but otherwise normal   IMPAIRED fasting glucose: See review of systems  Allergies as of 04/27/2016      Reactions   Penicillins    REACTION: allergy/hand swelling and itching      Medication List       Accurate as of 04/27/16 11:03 AM. Always use your most recent med list.          amLODipine 10 MG tablet Commonly known as:   NORVASC Take 1 tablet (10 mg total) by mouth daily.   BESIVANCE 0.6 % Susp Generic drug:  Besifloxacin HCl Reported on 10/25/2015   diclofenac sodium 1 % Gel Commonly known as:  VOLTAREN Apply 4 g topically 4 (four) times daily.   DUREZOL 0.05 % Emul Generic drug:  Difluprednate Reported on 10/25/2015   fexofenadine 180 MG tablet Commonly known as:  ALLEGRA ALLERGY Take 1 tablet (180 mg total) by mouth daily.   ILEVRO 0.3 % ophthalmic suspension Generic drug:  nepafenac Reported on 06/06/2015   metoprolol succinate 50 MG 24 hr tablet Commonly known as:  TOPROL-XL TAKE ONE-HALF TABLET BY MOUTH ONCE DAILY FOR 7 DAYS, THEN ONE TAB DAILY   olopatadine 0.1 % ophthalmic solution Commonly known as:  PATANOL Place 1 drop into both eyes 2 (two) times daily.   polyethylene glycol packet Commonly known as:  MIRALAX / GLYCOLAX Take 17 g by mouth daily.       Allergies:  Allergies  Allergen Reactions  . Penicillins     REACTION: allergy/hand swelling and itching    Past Medical History:  Diagnosis Date  . Allergy   . Arthritis   . Cataract   . GERD (gastroesophageal reflux disease)   . Hypertension   . Prediabetes     Past Surgical  History:  Procedure Laterality Date  . ABDOMINAL HYSTERECTOMY    . BUNIONECTOMY  1986   both  . CHOLECYSTECTOMY      Family History  Problem Relation Age of Onset  . Dementia Mother   . Hypertension Mother   . Diabetes Mother   . Cancer Father   . Cancer Sister   . Hyperlipidemia Sister   . Hypertension Sister   . Thyroid disease Maternal Aunt   . Diabetes Paternal Aunt     Social History:  reports that she has never smoked. She does not have any smokeless tobacco history on file. She reports that she does not drink alcohol or use drugs.  ROS  HYPERTENSION: Using metoprolol in addition to her amlodipine  WEIGHT history:  Wt Readings from Last 3 Encounters:  04/27/16 214 lb (97.1 kg)  10/25/15 212 lb (96.2 kg)  06/06/15  215 lb (97.5 kg)    IMPAIRED fasting glucose: Her glucose was 116 fasting previously She has difficulty losing weight  She is trying to cut back on carbohydrates and fried food  Not exercising regularly and not motivated  A1c is upper normal usually   Lab Results  Component Value Date   HGBA1C 6.2 10/22/2015   HGBA1C 6.3 04/23/2015   Lab Results  Component Value Date   LDLCALC 166 (H) 07/12/2014   CREATININE 0.78 10/22/2015     EXAM:  BP 128/70   Pulse 69   Ht 5\' 6"  (1.676 m)   Wt 214 lb (97.1 kg)   SpO2 97%   BMI 34.54 kg/m     Assessment:   HYPERCALCEMIA:  Patient has mild asymptomatic hyperparathyroidism She had variable degree of hypercalcemia since at least 2014 and More recently has been upper normal Again  is asymptomatic Has no adverse effects on bone density  IMPAIRED fasting glucose:  This will be assessed by labs today Discussed need for weight loss but she is not motivated Encouraged her to start walking program  PLAN:   Labs to be checked today including vitamin D level  Follow-up in 6 months  There are no Patient Instructions on file for this visit.   Yvonne Mahoney 04/27/2016, 11:03 AM

## 2016-04-28 NOTE — Progress Notes (Signed)
Please let patient know that the calcium is about the same as before Her diabetes test indicated she has borderline diabetes.  Recommend that she see the dietitian Vitamin D level is low, need to take 2000 instead of 1000 units vitamin D3 daily

## 2016-07-16 IMAGING — CR DG PELVIS 1-2V
1 series · 1 of 1 positions shown · non-contrast
Comparison: None.

CLINICAL DATA: 69-year-old female with left side back and pelvic
pain. Initial encounter.

EXAM:
PELVIS - 1-2 VIEW

[AP]
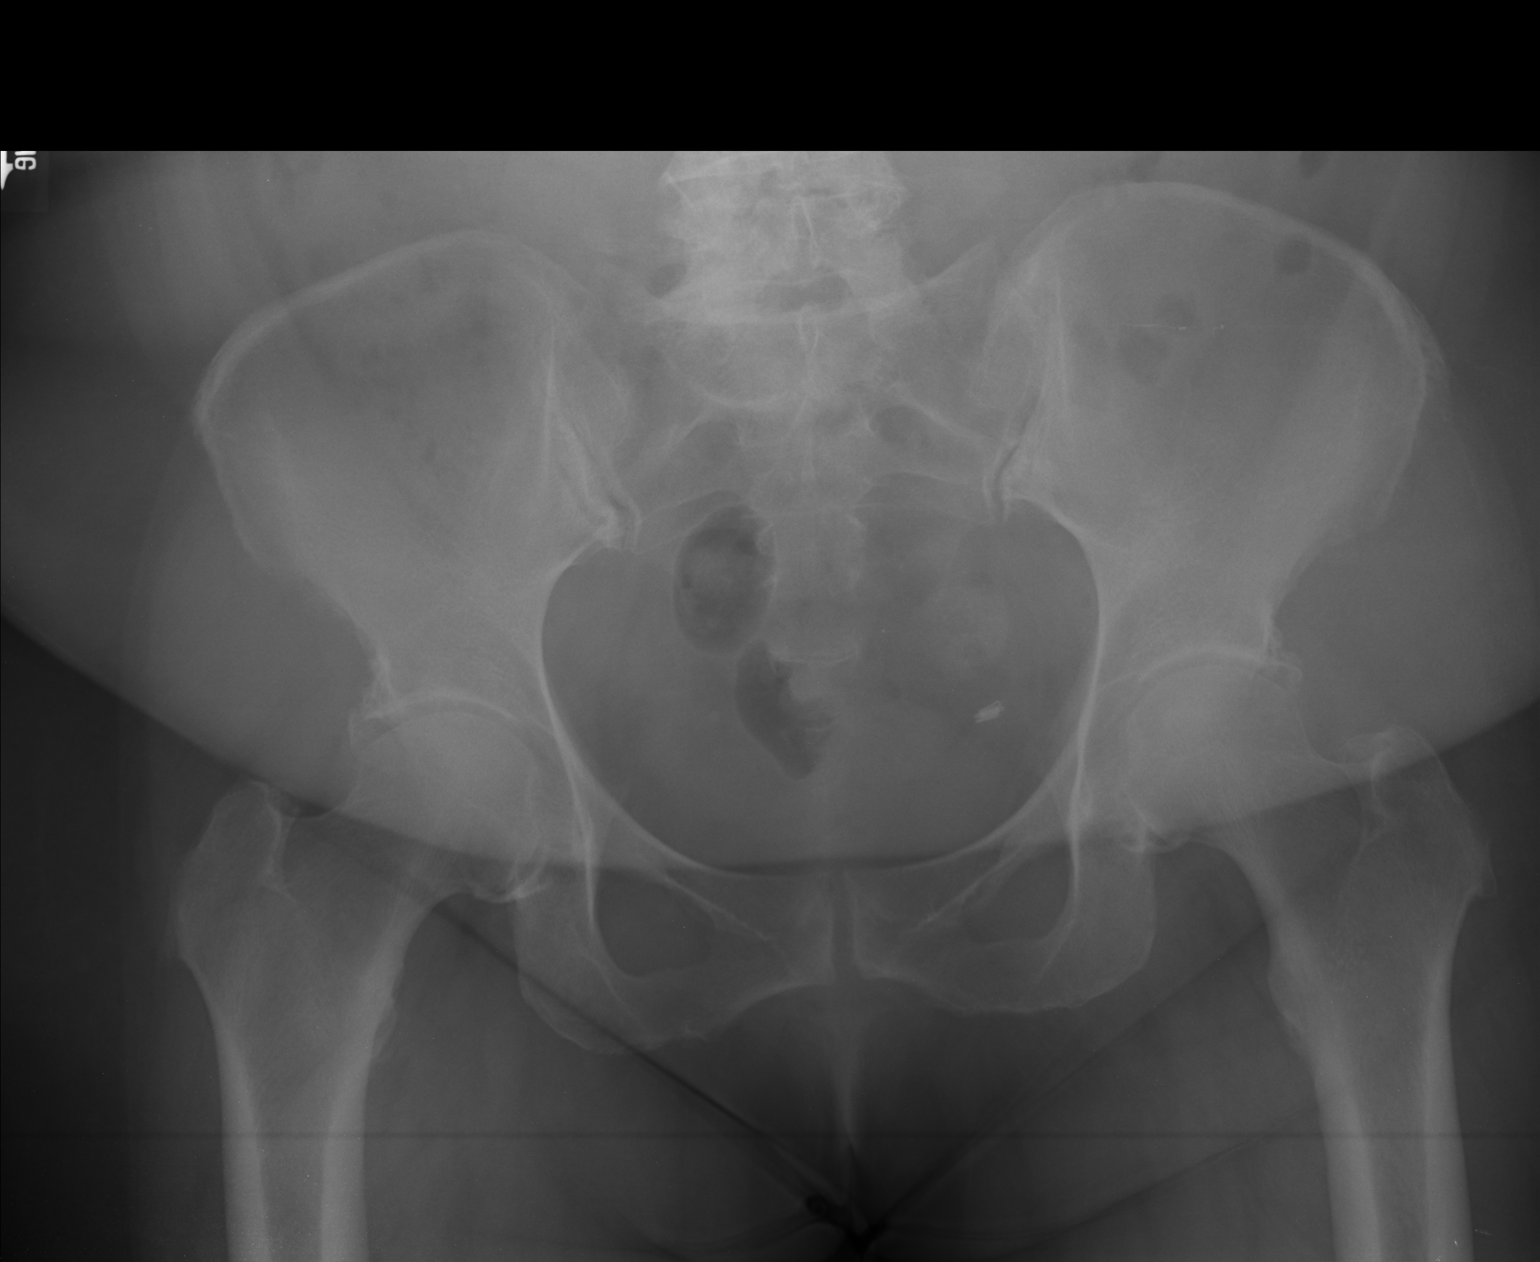

[1 of 1 positions shown; findings below may reference images not displayed]

FINDINGS: Femoral heads are normally located. Bone mineralization is within
normal limits for age. Pelvis intact. sacral ala and SI joints
within normal limits. Mildly asymmetric hip joint space loss on the
left. Bilateral acetabular sclerosis and osteophytosis. Both
proximal femurs appear grossly intact. Small surgical clips in the
left hemipelvis. Negative visible bowel gas pattern.
IMPRESSION: Left greater than right hip joint degeneration. No acute osseous
abnormality identified about the pelvis.

## 2016-07-16 IMAGING — CR DG LUMBAR SPINE 2-3V
3 series · 3 of 3 positions shown · non-contrast
Comparison: None.

CLINICAL DATA: Lumbago.  No history of trauma

EXAM:
LUMBAR SPINE - 2-3 VIEW

[AP (1 of 2)]
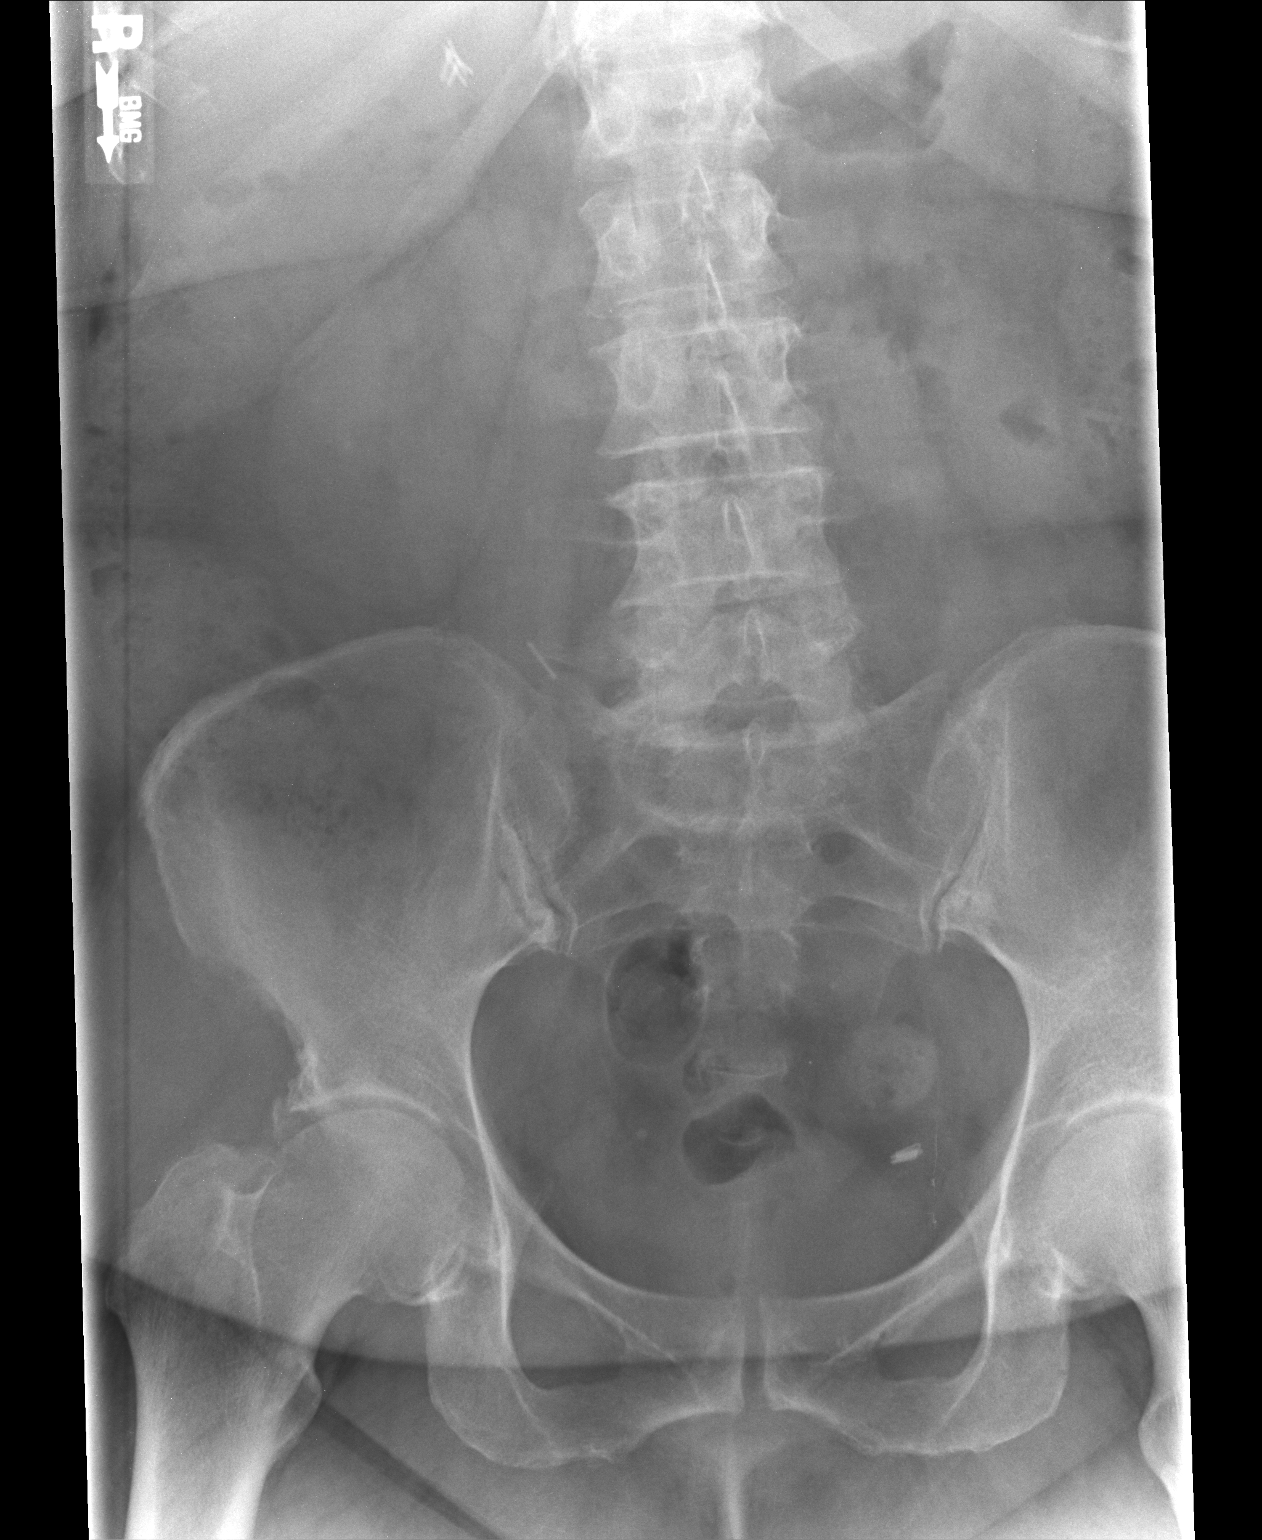

[AP (2 of 2)]
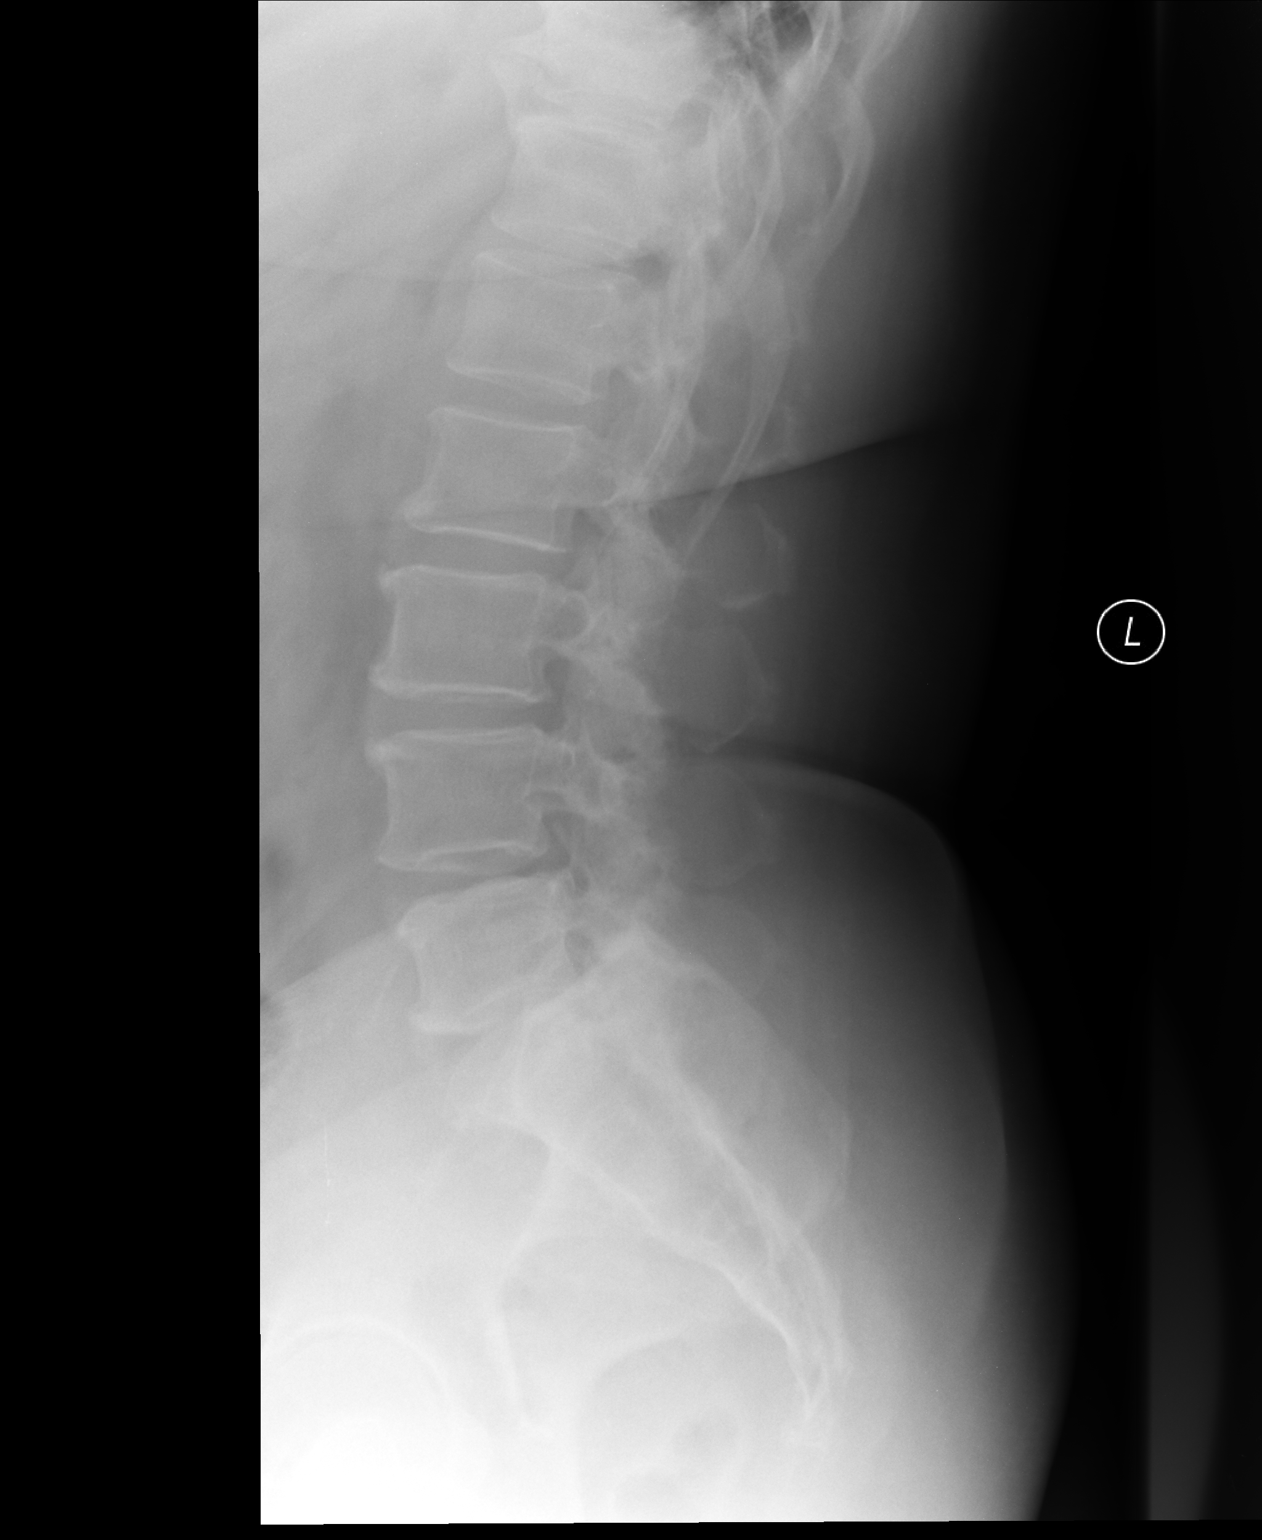

[l5 s1]
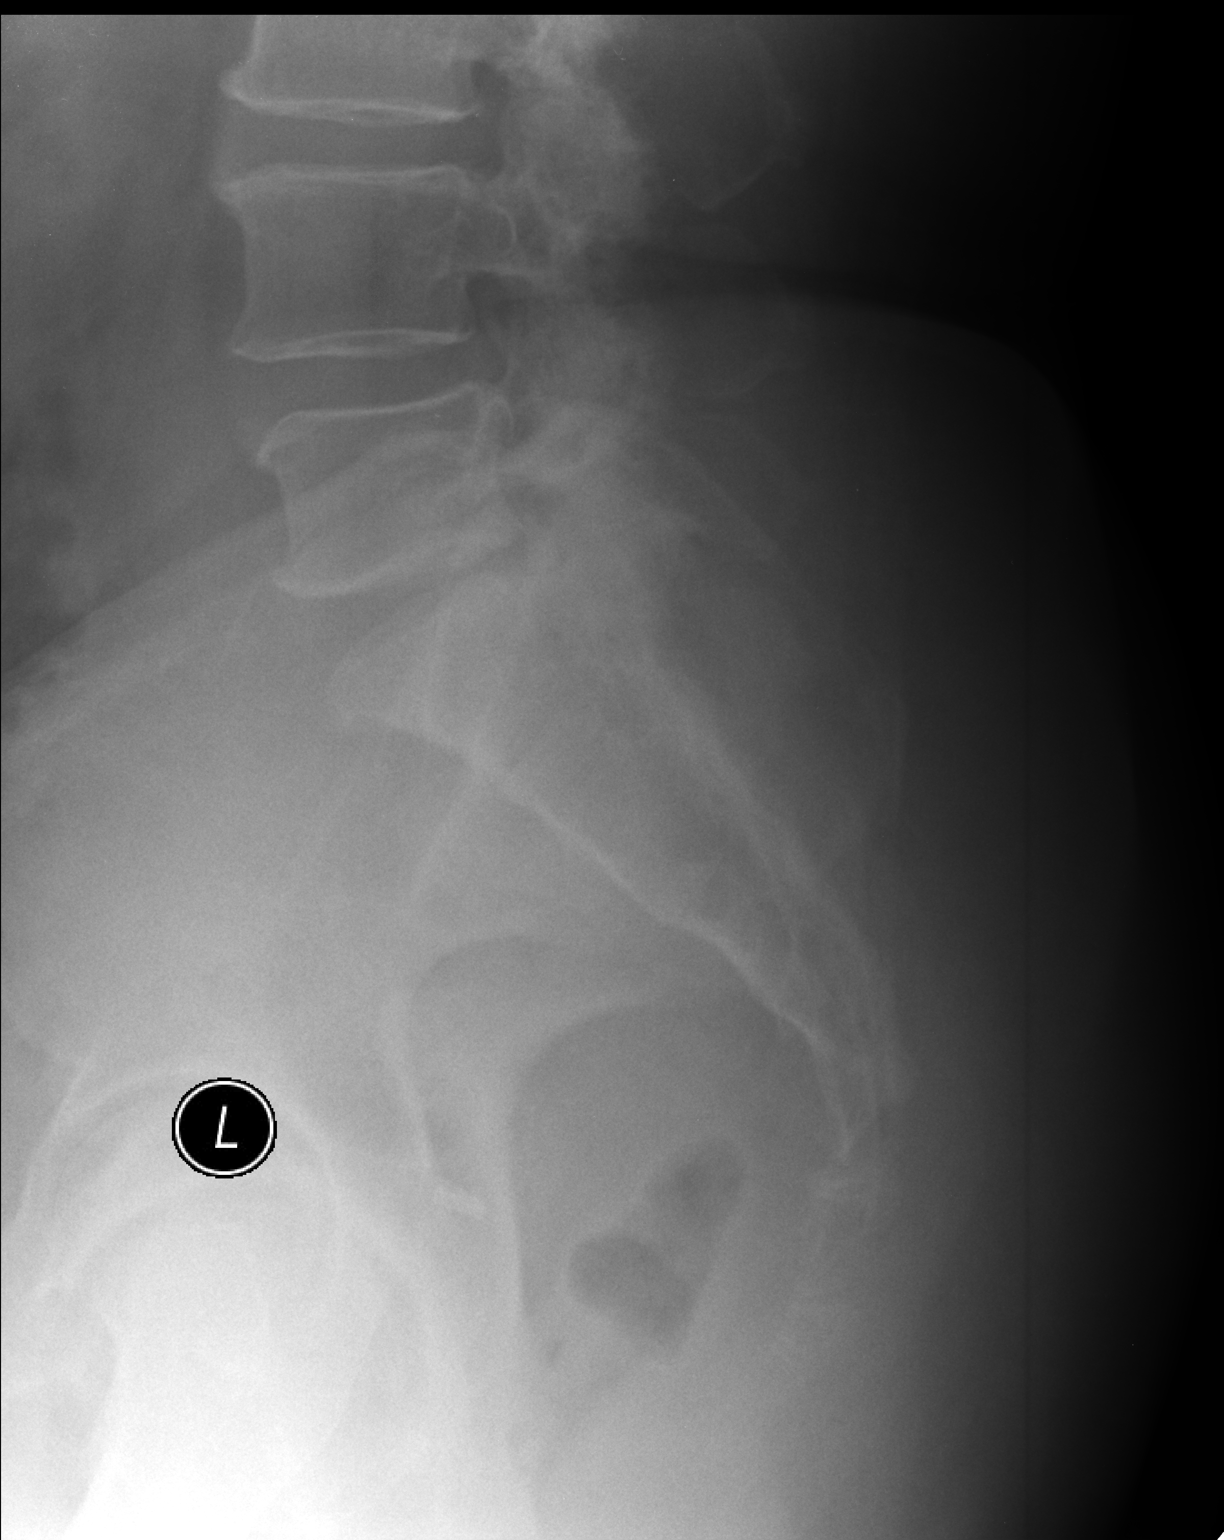

[3 of 3 positions shown; findings below may reference images not displayed]

FINDINGS: Frontal, lateral, and spot lumbosacral lateral images were obtained.
There are 5 non-rib-bearing lumbar type vertebral bodies. There is
no fracture or spondylolisthesis. There is moderate disc space
narrowing at L5-S1. Other disc spaces appear unremarkable. There are
anterior osteophytes at L2, L3, L4, and L5. No erosive change.
IMPRESSION: Osteoarthritic change, most notably at L5-S1. No fracture or
spondylolisthesis.

## 2016-07-21 ENCOUNTER — Other Ambulatory Visit: Payer: Self-pay | Admitting: Family Medicine

## 2016-07-21 DIAGNOSIS — Z1231 Encounter for screening mammogram for malignant neoplasm of breast: Secondary | ICD-10-CM

## 2016-07-29 ENCOUNTER — Emergency Department (HOSPITAL_COMMUNITY)
Admission: EM | Admit: 2016-07-29 | Discharge: 2016-07-29 | Disposition: A | Discharge status: Home / Self Care | Payer: Medicare Other | Attending: Emergency Medicine | Admitting: Emergency Medicine

## 2016-07-29 ENCOUNTER — Encounter (HOSPITAL_COMMUNITY): Payer: Self-pay | Admitting: Emergency Medicine

## 2016-07-29 DIAGNOSIS — I10 Essential (primary) hypertension: Secondary | ICD-10-CM | POA: Insufficient documentation

## 2016-07-29 DIAGNOSIS — Z79899 Other long term (current) drug therapy: Secondary | ICD-10-CM | POA: Insufficient documentation

## 2016-07-29 DIAGNOSIS — M546 Pain in thoracic spine: Secondary | ICD-10-CM | POA: Diagnosis present

## 2016-07-29 MED ORDER — OXYCODONE-ACETAMINOPHEN 5-325 MG PO TABS
1.0000 | ORAL_TABLET | Freq: Once | ORAL | Status: AC
  Administered 2016-07-29: 1 via ORAL

## 2016-07-29 MED ORDER — NAPROXEN 500 MG PO TABS: 500.0000 mg | ORAL_TABLET | Freq: Two times a day (BID) | ORAL | 0 refills | Status: DC

## 2016-07-29 MED ORDER — METHOCARBAMOL 500 MG PO TABS
1000.0000 mg | ORAL_TABLET | Freq: Once | ORAL | Status: AC
  Administered 2016-07-29: 1000 mg via ORAL
  Filled 2016-07-29: qty 2

## 2016-07-29 MED ORDER — OXYCODONE-ACETAMINOPHEN 5-325 MG PO TABS: 2.0000 | ORAL_TABLET | ORAL | 0 refills | Status: DC | PRN

## 2016-07-29 MED ORDER — METHOCARBAMOL 500 MG PO TABS: 500.0000 mg | ORAL_TABLET | Freq: Three times a day (TID) | ORAL | 0 refills | Status: DC | PRN

## 2016-07-29 MED ORDER — OXYCODONE-ACETAMINOPHEN 5-325 MG PO TABS
ORAL_TABLET | ORAL | Status: AC
  Filled 2016-07-29: qty 1

## 2016-07-29 NOTE — ED Triage Notes (Signed)
Patient reports mid back pain/stiffness  radiating to left lateral back onset last night , pain increases with movement /changing positions . Pt. took OTC pain medication with no relief.

## 2016-07-29 NOTE — ED Notes (Signed)
Pt verbalized understanding of discharge instructions.

## 2016-07-29 NOTE — ED Provider Notes (Signed)
[facility]-EMERGENCY DEPT Provider Note   CSN: [id number] Arrival date & time: 07/29/16  1850     History   Chief Complaint Chief Complaint  Patient presents with  . Back Pain    HPI Yvonne Mahoney is a 71 y.o. female. Chief complaint is back pain  HPI:  [age] old female no prior history of lumbar thoracic back pain. States for the last 24 hour she's had stiffness in her back. Systems primary to the left side of her spine. Her to cough bend or twist. No fever. No cough. Has no arm or leg symptoms. History of hypercalcemia. Pulses endocrinology. His never had compression fractures.  No chest pain or difficulty breathing. No dysuria or frequency. No nausea vomiting. Good appetite. States that she could barely stand up or leaning over even to address tonight. Was given pain medication triage states her symptoms are much improved here.  Past Medical History:  Diagnosis Date  . Allergy   . Arthritis   . Cataract   . GERD (gastroesophageal reflux disease)   . Hypertension   . Prediabetes     Patient Active Problem List   Diagnosis Date Noted  . Hyperparathyroidism, primary (HCC) 08/13/2014  . Hypertension 05/19/2012  . Osteoarthritis 05/19/2012    Past Surgical History:  Procedure Laterality Date  . ABDOMINAL HYSTERECTOMY    . BUNIONECTOMY  1986   both  . CHOLECYSTECTOMY      OB History    No data available       Home Medications    Prior to Admission medications   Medication Sig Start Date End Date Taking? Authorizing Provider  amLODipine (NORVASC) 10 MG tablet Take 1 tablet (10 mg total) by mouth daily. 09/17/14   Carmelina DaneJeffery S Anderson, MD  BESIVANCE 0.6 % SUSP Reported on 10/25/2015 08/31/14   Historical Provider, MD  diclofenac sodium (VOLTAREN) 1 % GEL Apply 4 g topically 4 (four) times daily. Patient taking differently: Apply 4 g topically as needed.  07/12/14   Elvina SidleKurt Lauenstein, MD  DUREZOL 0.05 % EMUL Reported on 10/25/2015 08/31/14   Historical Provider, MD    fexofenadine (ALLEGRA ALLERGY) 180 MG tablet Take 1 tablet (180 mg total) by mouth daily. Patient not taking: Reported on 04/27/2016 07/12/14   Elvina SidleKurt Lauenstein, MD  ILEVRO 0.3 % ophthalmic suspension Reported on 06/06/2015 08/31/14   Historical Provider, MD  methocarbamol (ROBAXIN) 500 MG tablet Take 1 tablet (500 mg total) by mouth 3 (three) times daily between meals as needed. 07/29/16   Rolland PorterMark Donaldo Teegarden, MD  metoprolol succinate (TOPROL-XL) 50 MG 24 hr tablet TAKE ONE-HALF TABLET BY MOUTH ONCE DAILY FOR 7 DAYS, THEN ONE TAB DAILY 08/22/15   Collene GobbleSteven A Daub, MD  naproxen (NAPROSYN) 500 MG tablet Take 1 tablet (500 mg total) by mouth 2 (two) times daily. 07/29/16   Rolland PorterMark Bennette Hasty, MD  olopatadine (PATANOL) 0.1 % ophthalmic solution Place 1 drop into both eyes 2 (two) times daily. Patient not taking: Reported on 04/27/2016 07/12/14   Elvina SidleKurt Lauenstein, MD  oxyCODONE-acetaminophen (PERCOCET/ROXICET) 5-325 MG tablet Take 2 tablets by mouth every 4 (four) hours as needed. 07/29/16   Rolland PorterMark Andrae Claunch, MD  polyethylene glycol [facility](MIRALAX / Ethelene HalGLYCOLAX) packet Take 17 g by mouth daily. 04/08/14   Linna HoffJames D Kindl, MD    Family History Family History  Problem Relation Age of Onset  . Dementia Mother   . Hypertension Mother   . Diabetes Mother   . Cancer Father   . Cancer Sister   .  Hyperlipidemia Sister   . Hypertension Sister   . Thyroid disease Maternal Aunt   . Diabetes Paternal Aunt     Social History Social History  Substance Use Topics  . Smoking status: Never Smoker  . Smokeless tobacco: Never Used  . Alcohol use No     Allergies   Penicillins   Review of Systems Review of Systems  Constitutional: Negative for appetite change, chills, diaphoresis, fatigue and fever.  HENT: Negative for mouth sores, sore throat and trouble swallowing.   Eyes: Negative for visual disturbance.  Respiratory: Negative for cough, chest tightness, shortness of breath and wheezing.   Cardiovascular: Negative for chest pain.   Gastrointestinal: Negative for abdominal distention, abdominal pain, diarrhea, nausea and vomiting.  Endocrine: Negative for polydipsia, polyphagia and polyuria.  Genitourinary: Negative for dysuria, frequency and hematuria.  Musculoskeletal: Positive for back pain. Negative for gait problem.  Skin: Negative for color change, pallor and rash.  Neurological: Negative for dizziness, syncope, light-headedness and headaches.  Hematological: Does not bruise/bleed easily.  Psychiatric/Behavioral: Negative for behavioral problems and confusion.     Physical Exam Updated Vital Signs BP (!) 145/73 (BP Location: Right Arm)   Pulse 84   Temp 99.1 F (37.3 C) (Oral)   Resp 18   Ht 5\' 4"  (1.626 m)   Wt 212 lb (96.2 kg)   SpO2 97%   BMI 36.39 kg/m   Physical Exam  Constitutional: She is oriented to person, place, and time. She appears well-developed and well-nourished. No distress.  HENT:  Head: Normocephalic.  Eyes: Conjunctivae are normal. Pupils are equal, round, and reactive to light. No scleral icterus.  Neck: Normal range of motion. Neck supple. No thyromegaly present.  Cardiovascular: Normal rate and regular rhythm.  Exam reveals no gallop and no friction rub.   No murmur heard. Pulmonary/Chest: Effort normal and breath sounds normal. No respiratory distress. She has no wheezes. She has no rales.  Clear bilateral breath sounds. She states with cough she does have some tightness or spasm. No pain currently with white respirations. No abnormal breath sounds. Temp 98.9. Saturations 98% on room air.  Abdominal: Soft. Bowel sounds are normal. She exhibits no distension. There is no tenderness. There is no rebound.  Musculoskeletal: Normal range of motion.       Back:  Neurological: She is alert and oriented to person, place, and time.  Normal symmetric Strength to shoulder shrug, triceps, biceps, grip,wrist flex/extend,and intrinsics  Norma lsymmetric sensation above and below  clavicles, and to all distributions to UEs. Norma symmetric strength to flex/.extend hip and knees, dorsi/plantar flex ankles. Normal symmetric sensation to all distributions to LEs Patellar and achilles reflexes 1-2+. Downgoing Babinski   Skin: Skin is warm and dry. No rash noted.  Psychiatric: She has a normal mood and affect. Her behavior is normal.     ED Treatments / Results  Labs (all labs ordered are listed, but only abnormal results are displayed) Labs Reviewed - No data to display  EKG  EKG Interpretation None       Radiology No results found.  Procedures Procedures (including critical care time)  Medications Ordered in ED Medications  oxyCODONE-acetaminophen (PERCOCET/ROXICET) 5-325 MG per tablet (not administered)  methocarbamol (ROBAXIN) tablet 1,000 mg (not administered)  oxyCODONE-acetaminophen (PERCOCET/ROXICET) 5-325 MG per tablet 1 tablet (1 tablet Oral Given 07/29/16 1931)     Initial Impression / Assessment and Plan / ED Course  I have reviewed the triage vital signs and the nursing notes.  Pertinent labs & imaging results that were available during my care of the patient were reviewed by me and considered in my medical decision making (see chart for details).     Muscular back pain. No findings to suggest thoracic disc. Clear lungs, doubt pneumonia. Doubt UTI as sx are clearly reproducible with exam and movement. Plan limited course of NSAID, and pain medication. Robaxin here, and Rx for home.   Recheck with fever, shortness of breath, or other symptoms.  PCP 4-5 days if not resolved.  Final Clinical Impressions(s) / ED Diagnoses   Final diagnoses:  Acute midline thoracic back pain    New Prescriptions New Prescriptions   METHOCARBAMOL (ROBAXIN) 500 MG TABLET    Take 1 tablet (500 mg total) by mouth 3 (three) times daily between meals as needed.   NAPROXEN (NAPROSYN) 500 MG TABLET    Take 1 tablet (500 mg total) by mouth 2 (two) times  daily.   OXYCODONE-ACETAMINOPHEN (PERCOCET/ROXICET) 5-325 MG TABLET    Take 2 tablets by mouth every 4 (four) hours as needed.     Rolland Porter, MD 07/29/16 2047

## 2016-07-29 NOTE — Discharge Instructions (Signed)
Ice to painful areas for the next 48 hours. Then heat as needed.  See your Dr. If not improving in the next 4-5 dau=ys.  Recheck with any new or worsening symptoms.

## 2016-08-10 ENCOUNTER — Ambulatory Visit
Admission: RE | Admit: 2016-08-10 | Discharge: 2016-08-10 | Disposition: A | Discharge status: Home / Self Care | Payer: Medicare Other | Source: Ambulatory Visit | Attending: Family Medicine | Admitting: Family Medicine

## 2016-08-10 DIAGNOSIS — Z1231 Encounter for screening mammogram for malignant neoplasm of breast: Secondary | ICD-10-CM

## 2016-10-07 ENCOUNTER — Encounter (INDEPENDENT_AMBULATORY_CARE_PROVIDER_SITE_OTHER): Payer: Self-pay | Admitting: Orthopaedic Surgery

## 2016-10-07 ENCOUNTER — Ambulatory Visit (INDEPENDENT_AMBULATORY_CARE_PROVIDER_SITE_OTHER): Payer: Medicare Other

## 2016-10-07 ENCOUNTER — Ambulatory Visit (INDEPENDENT_AMBULATORY_CARE_PROVIDER_SITE_OTHER): Payer: Medicare Other | Admitting: Orthopaedic Surgery

## 2016-10-07 DIAGNOSIS — M7502 Adhesive capsulitis of left shoulder: Secondary | ICD-10-CM

## 2016-10-07 DIAGNOSIS — M25512 Pain in left shoulder: Secondary | ICD-10-CM | POA: Diagnosis not present

## 2016-10-07 DIAGNOSIS — G8929 Other chronic pain: Secondary | ICD-10-CM

## 2016-10-07 MED ORDER — LIDOCAINE HCL 1 % IJ SOLN
2.0000 mL | INTRAMUSCULAR | Status: AC | PRN
  Administered 2016-10-07: 2 mL

## 2016-10-07 MED ORDER — BUPIVACAINE HCL 0.5 % IJ SOLN
2.0000 mL | INTRAMUSCULAR | Status: AC | PRN
  Administered 2016-10-07: 2 mL via INTRA_ARTICULAR

## 2016-10-07 MED ORDER — METHYLPREDNISOLONE ACETATE 40 MG/ML IJ SUSP
80.0000 mg | INTRAMUSCULAR | Status: AC | PRN
  Administered 2016-10-07: 80 mg

## 2016-10-07 NOTE — Progress Notes (Signed)
Office Visit Note   Patient: Yvonne Mahoney           Date of Birth: 1945-07-21           MRN: [id number] Visit Date: 10/07/2016              Requested by: Shirlean MylarWebb, Carol, MD [address] [place], [place] [address] PCP: Shirlean MylarWebb, Carol, MD   Assessment & Plan: Visit Diagnoses:  1. Chronic left shoulder pain   2. Adhesive capsulitis of left shoulder     Plan:  #1: Corticosteroid injection subacromially was performed. #2: Physical therapy for aggressive range of motion #3: If not beneficial and we can consider MRI scan of the left shoulder.  Follow-Up Instructions: Return if symptoms worsen or fail to improve.   Orders:  Orders Placed This Encounter  Procedures  . Large Joint Injection/Arthrocentesis  . XR Shoulder Left   No orders of the defined types were placed in this encounter.     Procedures: Large Joint Inj Date/Time: 10/07/2016 4:59 PM Performed by: Jacqualine CodePETRARCA, BRIAN D Authorized by: Jacqualine CodePETRARCA, BRIAN D   Consent Given by:  Patient Timeout: prior to procedure the correct patient, procedure, and site was verified   Indications:  Pain Location:  Shoulder Site:  L subacromial bursa Prep: patient was prepped and draped in usual sterile fashion   Needle Size:  25 G Needle Length:  1.5 inches Approach:  Lateral Ultrasound Guidance: No   Fluoroscopic Guidance: No   Arthrogram: No   Medications:  80 mg methylPREDNISolone acetate 40 MG/ML; 2 mL lidocaine 1 %; 2 mL bupivacaine 0.5 % Aspiration Attempted: No   Patient tolerance:  Patient tolerated the procedure well with no immediate complications     Clinical Data: No additional findings.   Subjective: Chief Complaint  Patient presents with  . Left Shoulder - Pain    Yvonne Mahoney is a 71 year old African-American female who is seen today with a chief complaint of painful left shoulder. She states back in March 2018 she started developing pain and loss of motion of the left shoulder. She does  not remember any history of injury or trauma though she was at a funeral and was doing a lot of passing of food she had been treated with the oxycodone, naproxen, and Robaxin which made her sick. She was then changed to tramadol and tizaidine. She also use muscle rubs as well as over-the-counter patches and hot showers. Has gotten to the point now where she has limited range of motion and significant pain and discomfort with even lying on her shoulder and arm. Her pain is referred down into the mid humeral area.    Review of Systems  Constitutional: Negative.   HENT: Negative.   Respiratory: Negative.   Cardiovascular: Negative.   Gastrointestinal: Negative.   Genitourinary: Negative.   Skin: Negative.   Neurological: Negative.   Hematological: Negative.   Psychiatric/Behavioral: Negative.      Objective: Vital Signs: There were no vitals taken for this visit.  Physical Exam  Constitutional: She is oriented to person, place, and time. She appears well-developed and well-nourished.  HENT:  Head: Normocephalic and atraumatic.  Eyes: EOM are normal. Pupils are equal, round, and reactive to light.  Pulmonary/Chest: Effort normal.  Neurological: She is alert and oriented to person, place, and time.  Skin: Skin is warm and dry.  Psychiatric: She has a normal mood and affect. Her behavior is normal. Judgment and thought content normal.  Ortho Exam  Exam today of the left shoulder reveals passively I can get her to about 60-70 of abduction. Forward flexion to almost run 80-90. With the arm at about 70 of abduction   She only has about 30 of internal and external rotation. positive empty can test  Specialty Comments:  No specialty comments available.  Imaging: Xr Shoulder Left  Result Date: 10/07/2016 Two-view x-ray of the left shoulder reveals a type III acromium. Narrowing of the subacromial space to about 7 mm. Glenohumeral joint does reveal some inferior spurring of the  glenoid but good maintenance of the joint space. Some before meals OA is noted.    PMFS History: Patient Active Problem List   Diagnosis Date Noted  . Hyperparathyroidism, primary (HCC) 08/13/2014  . Hypertension 05/19/2012  . Osteoarthritis 05/19/2012   Past Medical History:  Diagnosis Date  . Allergy   . Arthritis   . Cataract   . GERD (gastroesophageal reflux disease)   . Hypertension   . Prediabetes     Family History  Problem Relation Age of Onset  . Dementia Mother   . Hypertension Mother   . Diabetes Mother   . Cancer Father   . Cancer Sister   . Hyperlipidemia Sister   . Hypertension Sister   . Thyroid disease Maternal Aunt   . Breast cancer Maternal Aunt   . Diabetes Paternal Aunt     Past Surgical History:  Procedure Laterality Date  . ABDOMINAL HYSTERECTOMY    . BUNIONECTOMY  1986   both  . CHOLECYSTECTOMY     Social History   Occupational History  . Social Worker    Social History Main Topics  . Smoking status: Never Smoker  . Smokeless tobacco: Never Used  . Alcohol use No  . Drug use: No  . Sexual activity: No     Comment: number of sex partners in the last 12 months- 0

## 2016-10-08 ENCOUNTER — Other Ambulatory Visit: Payer: Self-pay

## 2016-10-08 DIAGNOSIS — M25512 Pain in left shoulder: Principal | ICD-10-CM

## 2016-10-08 DIAGNOSIS — G8929 Other chronic pain: Secondary | ICD-10-CM

## 2016-10-13 ENCOUNTER — Ambulatory Visit (INDEPENDENT_AMBULATORY_CARE_PROVIDER_SITE_OTHER): Payer: Medicare Other | Admitting: Sports Medicine

## 2016-10-13 DIAGNOSIS — M79672 Pain in left foot: Secondary | ICD-10-CM

## 2016-10-13 DIAGNOSIS — L84 Corns and callosities: Secondary | ICD-10-CM

## 2016-10-13 DIAGNOSIS — M79671 Pain in right foot: Secondary | ICD-10-CM

## 2016-10-13 NOTE — Progress Notes (Signed)
Subjective: Yvonne Mahoney is a 71 y.o. female patient who presents to office for evaluation of Right> Left foot pain secondary to callus skin. Patient complains of pain at the lesion present Right>Left foot at the bottom. Patient has tried pedicure with a little relief in symptoms. Patient denies any other pedal complaints.   Patient Active Problem List   Diagnosis Date Noted  . Hyperparathyroidism, primary (HCC) 08/13/2014  . Hypertension 05/19/2012  . Osteoarthritis 05/19/2012    Current Outpatient Prescriptions on File Prior to Visit  Medication Sig Dispense Refill  . amLODipine (NORVASC) 10 MG tablet Take 1 tablet (10 mg total) by mouth daily. 90 tablet 3  . BESIVANCE 0.6 % SUSP Reported on 10/25/2015    . diclofenac sodium (VOLTAREN) 1 % GEL Apply 4 g topically 4 (four) times daily. (Patient taking differently: Apply 4 g topically as needed. ) 100 g 1  . DUREZOL 0.05 % EMUL Reported on 10/25/2015  1  . fexofenadine (ALLEGRA ALLERGY) 180 MG tablet Take 1 tablet (180 mg total) by mouth daily. (Patient not taking: Reported on 04/27/2016) 20 tablet 0  . ILEVRO 0.3 % ophthalmic suspension Reported on 06/06/2015  1  . methocarbamol (ROBAXIN) 500 MG tablet Take 1 tablet (500 mg total) by mouth 3 (three) times daily between meals as needed. 20 tablet 0  . metoprolol succinate (TOPROL-XL) 50 MG 24 hr tablet TAKE ONE-HALF TABLET BY MOUTH ONCE DAILY FOR 7 DAYS, THEN ONE TAB DAILY 30 tablet 0  . naproxen (NAPROSYN) 500 MG tablet Take 1 tablet (500 mg total) by mouth 2 (two) times daily. 8 tablet 0  . olopatadine (PATANOL) 0.1 % ophthalmic solution Place 1 drop into both eyes 2 (two) times daily. (Patient not taking: Reported on 04/27/2016) 5 mL 1  . oxyCODONE-acetaminophen (PERCOCET/ROXICET) 5-325 MG tablet Take 2 tablets by mouth every 4 (four) hours as needed. 12 tablet 0  . polyethylene glycol (MIRALAX / GLYCOLAX) packet Take 17 g by mouth daily. 30 each 0   No current facility-administered  medications on file prior to visit.     Allergies  Allergen Reactions  . Penicillins     REACTION: allergy/hand swelling and itching    Objective:  General: Alert and oriented x3 in no acute distress  Dermatology: Keratotic lesion present right and left plantar foot x 4 with skin lines transversing the lesions, pain is present with direct pressure to the lesion with a central nucleated core noted, no webspace macerations, no ecchymosis bilateral, all nails x 10 are well manicured. Feet are dirty had to clean with alcohol and encourage patient to clean feet thoroughly.   Vascular: Dorsalis Pedis and Posterior Tibial pedal pulses 2/4, Capillary Fill Time 3 seconds, + pedal hair growth bilateral, no edema bilateral lower extremities, Temperature gradient within normal limits.  Neurology: Michaell CowingGross sensation intact via light touch bilateral.  Musculoskeletal: Mild tenderness with palpation at the keratotic lesion sites on Right>Left, Pes planus foot type, Muscular strength 5/5 in all groups without pain or limitation on range of motion.   Assessment and Plan: Problem List Items Addressed This Visit    None    Visit Diagnoses    Callus of foot    -  Primary   Foot pain, bilateral          -Complete examination performed -Discussed treatment options -Parred keratoic lesions using a chisel blade; treated the area withSalinocaine covered with moleskin -Encouraged daily skin emollients; Recommended Revitaderm  -Encouraged use of pumice stone -Advised  good supportive shoes and inserts -Patient to return to office as needed or sooner if condition worsens.  Asencion Islam, DPM

## 2016-10-13 NOTE — Patient Instructions (Signed)
For tennis shoes recommend:  Brooks Beast Ascis New balance [place] Can be purchased at [employer] or [employer]  Vionic  SAS Can be purchased at [employer] or [employer]   For work shoes recommend: S[employer]chers Work Timberland boots  Can be purchased at a variety of places or [employer]   For casual shoes recommend: Vionic  Can be purchased at [employer] or [employer]  

## 2016-10-14 NOTE — Addendum Note (Signed)
Addended by: Marylou MccoyQUINTANA, Manjit Bufano L on: 10/14/2016 04:43 PM   Modules accepted: Orders

## 2016-10-26 ENCOUNTER — Other Ambulatory Visit (INDEPENDENT_AMBULATORY_CARE_PROVIDER_SITE_OTHER): Payer: Medicare Other

## 2016-10-26 DIAGNOSIS — R7303 Prediabetes: Secondary | ICD-10-CM

## 2016-10-26 LAB — COMPREHENSIVE METABOLIC PANEL
ALT: 22 U/L (ref 0–35)
AST: 19 U/L (ref 0–37)
Albumin: 3.8 g/dL (ref 3.5–5.2)
Alkaline Phosphatase: 76 U/L (ref 39–117)
BILIRUBIN TOTAL: 0.3 mg/dL (ref 0.2–1.2)
BUN: 14 mg/dL (ref 6–23)
CO2: 29 meq/L (ref 19–32)
CREATININE: 0.67 mg/dL (ref 0.40–1.20)
Calcium: 10.4 mg/dL (ref 8.4–10.5)
Chloride: 105 mEq/L (ref 96–112)
GFR: 110.89 mL/min (ref >60.00)
GLUCOSE: 110 mg/dL — AB (ref 70–99)
Potassium: 4.2 mEq/L (ref 3.5–5.1)
Sodium: 137 mEq/L (ref 135–145)
Total Protein: 7 g/dL (ref 6.0–8.3)

## 2016-10-26 LAB — HEMOGLOBIN A1C: HEMOGLOBIN A1C: 6.5 % (ref 4.6–6.5)

## 2016-10-29 ENCOUNTER — Telehealth: Payer: Self-pay

## 2016-10-29 ENCOUNTER — Ambulatory Visit (INDEPENDENT_AMBULATORY_CARE_PROVIDER_SITE_OTHER): Payer: Medicare Other | Admitting: Endocrinology

## 2016-10-29 ENCOUNTER — Encounter: Payer: Self-pay | Admitting: Endocrinology

## 2016-10-29 VITALS — BP 126/74 | HR 73 | Ht 66.0 in | Wt 210.6 lb

## 2016-10-29 DIAGNOSIS — R7303 Prediabetes: Secondary | ICD-10-CM | POA: Diagnosis not present

## 2016-10-29 NOTE — Telephone Encounter (Signed)
error 

## 2016-10-29 NOTE — Patient Instructions (Signed)
Start walking program 

## 2016-10-29 NOTE — Progress Notes (Signed)
Patient ID: Yvonne Mahoney, female   DOB: [birth year], 71 y.o.   MRN: [id number]     Chief complaint: Endocrinology follow-up  History of Present Illness:  High calcium :  This is from hyperparathyroidism  Review of records show that she has had a high calcium since 05/2012:   Because of her relatively high calcium level her HCTZ was discontinued on her initial consultation  She has had variable levels but consistently below 11 since 8/16   Lab Results  Component Value Date   CALCIUM 10.4 10/26/2016   CALCIUM 10.6 (H) 04/27/2016   CALCIUM 10.5 10/22/2015   CALCIUM 10.7 (H) 04/23/2015   CALCIUM 10.3 12/21/2014   CALCIUM 11.1 (H) 09/19/2014   CALCIUM 11.9 (H) 07/20/2014   CALCIUM 11.6 (H) 07/12/2014   CALCIUM 10.9 (H) 05/25/2013   She has been asymptomatic  The hypercalcemia is not associated with any history of pathologic fractures, osteoporosis, renal insufficiency, kidney stones, sarcoidosis, known carcinoma   Recent history Calcium is again improved and normal now   Not taking any calcium supplements  She has been taking vitamin D 3, 2000 units dailyBecause of vitamin D deficiency   Prior serologic and radiologic studies:  25 (OH) Vitamin D level was  18    PTH from [facility] was 215 prior to her initial visit  Lab Results  Component Value Date   PTH 57 09/19/2014   PTH Comment 09/19/2014   CALCIUM 10.4 10/26/2016   PHOS 3.1 04/23/2015    Lab Results  Component Value Date   VD25OH 18.05 (L) 04/27/2016   VD25OH 46.0 08/13/2014    Bone density:  This was done in 7/16.  She had only minimal decrease with T score -1.4 at the right femoral neck but otherwise normal   IMPAIRED fasting glucose: See review of systems  Allergies as of 10/29/2016      Reactions   Methocarbamol Nausea And Vomiting   Naproxen Nausea And Vomiting   Oxycodone Nausea And Vomiting   Penicillins    REACTION: allergy/hand swelling and itching      Medication  List       Accurate as of 10/29/16 12:57 PM. Always use your most recent med list.          amLODipine 10 MG tablet Commonly known as:  NORVASC Take 1 tablet (10 mg total) by mouth daily.   diclofenac sodium 1 % Gel Commonly known as:  VOLTAREN Apply 4 g topically 4 (four) times daily.   fexofenadine 180 MG tablet Commonly known as:  ALLEGRA ALLERGY Take 1 tablet (180 mg total) by mouth daily.   metoprolol succinate 50 MG 24 hr tablet Commonly known as:  TOPROL-XL TAKE ONE-HALF TABLET BY MOUTH ONCE DAILY FOR 7 DAYS, THEN ONE TAB DAILY   tiZANidine 2 MG tablet Commonly known as:  ZANAFLEX   Vitamin D 2000 units Caps Take by mouth.       Allergies:  Allergies  Allergen Reactions  . Methocarbamol Nausea And Vomiting  . Naproxen Nausea And Vomiting  . Oxycodone Nausea And Vomiting  . Penicillins     REACTION: allergy/hand swelling and itching    Past Medical History:  Diagnosis Date  . Allergy   . Arthritis   . Cataract   . GERD (gastroesophageal reflux disease)   . Hypertension   . Prediabetes     Past Surgical History:  Procedure Laterality Date  . ABDOMINAL HYSTERECTOMY    . BUNIONECTOMY  [phone]  both  . CHOLECYSTECTOMY      Family History  Problem Relation Age of Onset  . Dementia Mother   . Hypertension Mother   . Diabetes Mother   . Cancer Father   . Cancer Sister   . Hyperlipidemia Sister   . Hypertension Sister   . Thyroid disease Maternal Aunt   . Breast cancer Maternal Aunt   . Diabetes Paternal Aunt     Social History:  reports that she has never smoked. She has never used smokeless tobacco. She reports that she does not drink alcohol or use drugs.  ROS  HYPERTENSION: Using metoprolol in addition to her amlodipine  WEIGHT history:  Wt Readings from Last 3 Encounters:  10/29/16 210 lb 9.6 oz (95.5 kg)  07/29/16 212 lb (96.2 kg)  04/27/16 214 lb (97.1 kg)    IMPAIRED fasting glucose: Her glucose was 116 fasting previously,  now 110 She has difficulty losing weight   Not exercising    A1c is upper normal usually   Lab Results  Component Value Date   HGBA1C 6.5 10/26/2016   HGBA1C 6.5 04/27/2016   HGBA1C 6.2 10/22/2015   Lab Results  Component Value Date   LDLCALC 134 (H) 04/27/2016   CREATININE 0.67 10/26/2016     EXAM:  BP 126/74   Pulse 73   Ht 5\' 6"  (1.676 m)   Wt 210 lb 9.6 oz (95.5 kg)   SpO2 97%   BMI 33.99 kg/m     Assessment:   HYPERCALCEMIA:  Patient has mild asymptomatic hyperparathyroidism She had variable degree of hypercalcemia since at least 2014  The calcium  recently has been upper normal Again  is asymptomatic Has no evidence of osteopenia   IMPAIRED fasting glucose:   This is still not adequately controlled and A1c is still 6.5 Discussed that weight loss is important    PLAN:     Encouraged her to start walking program She will follow instructions given to her by the dietitian last year   Since she does not have any active problems she can continue follow-up with her PCP including for her prediabetes  Follow-up as needed   Patient Instructions   Start walking program      [facility] 10/29/2016, 12:57 PM

## 2017-03-01 ENCOUNTER — Telehealth: Payer: Self-pay

## 2017-03-01 NOTE — Telephone Encounter (Signed)
Patient is currently under the care of [facility], Shirlean Mylararol Webb, MD. She is no longer actively our patient.

## 2017-07-26 ENCOUNTER — Other Ambulatory Visit: Payer: Self-pay | Admitting: Family Medicine

## 2017-07-26 DIAGNOSIS — Z1231 Encounter for screening mammogram for malignant neoplasm of breast: Secondary | ICD-10-CM

## 2017-08-12 ENCOUNTER — Ambulatory Visit: Payer: Medicare Other

## 2017-08-26 ENCOUNTER — Ambulatory Visit
Admission: RE | Admit: 2017-08-26 | Discharge: 2017-08-26 | Disposition: A | Discharge status: Home / Self Care | Payer: Medicare Other | Source: Ambulatory Visit | Attending: Family Medicine | Admitting: Family Medicine

## 2017-08-26 DIAGNOSIS — Z1231 Encounter for screening mammogram for malignant neoplasm of breast: Secondary | ICD-10-CM

## 2019-10-28 ENCOUNTER — Other Ambulatory Visit: Payer: Self-pay

## 2019-10-28 ENCOUNTER — Encounter (HOSPITAL_COMMUNITY): Payer: Self-pay | Admitting: Emergency Medicine

## 2019-10-28 ENCOUNTER — Emergency Department (HOSPITAL_COMMUNITY)
Admission: EM | Admit: 2019-10-28 | Discharge: 2019-10-28 | Disposition: A | Discharge status: Home / Self Care | Payer: Medicare Other | Attending: Emergency Medicine | Admitting: Emergency Medicine

## 2019-10-28 DIAGNOSIS — Y92009 Unspecified place in unspecified non-institutional (private) residence as the place of occurrence of the external cause: Secondary | ICD-10-CM | POA: Insufficient documentation

## 2019-10-28 DIAGNOSIS — I10 Essential (primary) hypertension: Secondary | ICD-10-CM | POA: Insufficient documentation

## 2019-10-28 DIAGNOSIS — Y999 Unspecified external cause status: Secondary | ICD-10-CM | POA: Diagnosis not present

## 2019-10-28 DIAGNOSIS — W57XXXA Bitten or stung by nonvenomous insect and other nonvenomous arthropods, initial encounter: Secondary | ICD-10-CM

## 2019-10-28 DIAGNOSIS — S3992XA Unspecified injury of lower back, initial encounter: Secondary | ICD-10-CM | POA: Diagnosis present

## 2019-10-28 DIAGNOSIS — S30860A Insect bite (nonvenomous) of lower back and pelvis, initial encounter: Secondary | ICD-10-CM | POA: Diagnosis not present

## 2019-10-28 DIAGNOSIS — Y939 Activity, unspecified: Secondary | ICD-10-CM | POA: Diagnosis not present

## 2019-10-28 DIAGNOSIS — Z79899 Other long term (current) drug therapy: Secondary | ICD-10-CM | POA: Insufficient documentation

## 2019-10-28 MED ORDER — DOXYCYCLINE HYCLATE 100 MG PO TABS
200.0000 mg | ORAL_TABLET | Freq: Once | ORAL | Status: AC
  Administered 2019-10-28: 200 mg via ORAL
  Filled 2019-10-28: qty 2

## 2019-10-28 NOTE — ED Provider Notes (Signed)
[facility] EMERGENCY DEPARTMENT Provider Note   CSN: [id number] Arrival date & time: 10/28/19  1957    History Chief Complaint  Patient presents with  . Insect Bite    Yvonne Mahoney is a 74 y.o. female with no significant past medical history significant for GERD, hypertension who presents for evaluation of possible tick bite.  Patient states yesterday she thought she felt something crawling on right lateral side of her abdomen.  She states her friend then "flicked off a tick."  She is unsure if this bit her.  She states since then she feels like things are crawling on her.  She states she was at her friend's house earlier today when it was "dirty."  She felt something jump on her.  She states the person does have dogs and thought it might be "fleas."  She denies fever, chills, nausea, vomiting, chest pain, shortness of breath, redness, swelling, warmth.  Denies additional aggravating or relieving factors.  Has taken nothing for symptoms.  Patient states she is here to "get a full body check to make sure there are no more tics."  History of obtain from patient and past medical records.  No interpreter used.  HPI     Past Medical History:  Diagnosis Date  . Allergy   . Arthritis   . Cataract   . GERD (gastroesophageal reflux disease)   . Hypertension   . Prediabetes     Patient Active Problem List   Diagnosis Date Noted  . Hyperparathyroidism, primary (HCC) 08/13/2014  . Hypertension 05/19/2012  . Osteoarthritis 05/19/2012    Past Surgical History:  Procedure Laterality Date  . ABDOMINAL HYSTERECTOMY    . BUNIONECTOMY  1986   both  . CHOLECYSTECTOMY       OB History   No obstetric history on file.     Family History  Problem Relation Age of Onset  . Dementia Mother   . Hypertension Mother   . Diabetes Mother   . Cancer Father   . Cancer Sister   . Hyperlipidemia Sister   . Hypertension Sister   . Thyroid disease Maternal Aunt   . Breast cancer  Maternal Aunt   . Diabetes Paternal Aunt     Social History   Tobacco Use  . Smoking status: Never Smoker  . Smokeless tobacco: Never Used  Substance Use Topics  . Alcohol use: No  . Drug use: No    Home Medications Prior to Admission medications   Medication Sig Start Date End Date Taking? Authorizing Provider  amLODipine (NORVASC) 10 MG tablet Take 1 tablet (10 mg total) by mouth daily. 09/17/14   Carmelina Dane, MD  Cholecalciferol (VITAMIN D) 2000 units CAPS Take by mouth.    [provider]  diclofenac sodium (VOLTAREN) 1 % GEL Apply 4 g topically 4 (four) times daily. Patient taking differently: Apply 4 g topically as needed.  07/12/14   [name], MD  fexofenadine [facility] ALLERGY) 180 MG tablet Take 1 tablet (180 mg total) by mouth daily. Patient not taking: Reported on 04/27/2016 07/12/14   [name], MD  metoprolol succinate (TOPROL-XL) 50 MG 24 hr tablet TAKE ONE-HALF TABLET BY MOUTH ONCE DAILY FOR 7 DAYS, THEN ONE TAB DAILY Patient taking differently: TAKE ONE TABLET BY MOUTH ONCE DAILY 08/22/15   Collene Gobble, MD  tiZANidine (ZANAFLEX) 2 MG tablet  07/31/16   [provider]    Allergies    Methocarbamol, Naproxen, Oxycodone, and Penicillins  Review  of Systems   Review of Systems  Constitutional: Negative.   HENT: Negative.   Respiratory: Negative.   Cardiovascular: Negative.   Gastrointestinal: Negative.   Genitourinary: Negative.   Musculoskeletal: Negative.   Skin: Negative.   Neurological: Negative.   All other systems reviewed and are negative.   Physical Exam Updated Vital Signs BP 131/70 (BP Location: Right Arm)   Pulse 73   Temp 98.1 F (36.7 C) (Rectal)   Ht 5\' 4"  (1.626 m)   Wt 95.3 kg   SpO2 98%   BMI 36.05 kg/m   Physical Exam Vitals and nursing note reviewed.  Constitutional:      General: She is not in acute distress.    Appearance: She is well-developed. She is not ill-appearing, toxic-appearing or  diaphoretic.  HENT:     Head: Normocephalic and atraumatic.     Nose: Nose normal.     Mouth/Throat:     Mouth: Mucous membranes are moist.  Eyes:     Pupils: Pupils are equal, round, and reactive to light.  Cardiovascular:     Rate and Rhythm: Normal rate.     Pulses: Normal pulses.     Heart sounds: Normal heart sounds.  Pulmonary:     Effort: Pulmonary effort is normal. No respiratory distress.     Breath sounds: Normal breath sounds.  Abdominal:     General: Bowel sounds are normal. There is no distension.  Musculoskeletal:        General: Normal range of motion.     Cervical back: Normal range of motion.     Comments: Moves all 4 extremities at difficulty.  No bony tenderness.  Compartments soft.  Skin:    General: Skin is warm and dry.     Capillary Refill: Capillary refill takes less than 2 seconds.     Comments: No edema, erythema or warmth.  No bulla, target lesions, fluctuance, induration, desquamated skin.  No vesicles, purpura.  Neurological:     General: No focal deficit present.     Mental Status: She is alert and oriented to person, place, and time.     Comments: Ambulatory in room without difficulty     ED Results / Procedures / Treatments   Labs (all labs ordered are listed, but only abnormal results are displayed) Labs Reviewed - No data to display  EKG None  Radiology No results found.  Procedures Procedures (including critical care time)  Medications Ordered in ED Medications  doxycycline (VIBRA-TABS) tablet 200 mg (200 mg Oral Given 10/28/19 2055)    ED Course  I have reviewed the triage vital signs and the nursing notes.  Pertinent labs & imaging results that were available during my care of the patient were reviewed by me and considered in my medical decision making (see chart for details).  74 year old female presents for evaluation of possible insect bite.  A close friend "flicked off the tick."  She is unsure if this bit her.  She has  no edema, erythema or warmth.  No fluctuance or induration.  She appears overall well.  Neuromusculoskeletal exam.  Neurovascularly intact. No evidence of retained tick particles.  Patient denies any difficulty breathing or swallowing.  Pt has a patent airway without stridor and is handling secretions without difficulty; no angioedema. No blisters, no pustules, no warmth, no draining sinus tracts, no superficial abscesses, no bullous impetigo, no vesicles, no desquamation, no target lesions with dusky purpura or a central bulla. Not tender to touch. No concern  for superimposed infection. No concern for SJS, TEN, TSS, syphilis or other life-threatening condition. Will give 1 dose Doxy 200 mg for possible tick bite and have her recheck with PCP.  The patient has been appropriately medically screened and/or stabilized in the ED. I have low suspicion for any other emergent medical condition which would require further screening, evaluation or treatment in the ED or require inpatient management.  Patient is hemodynamically stable and in no acute distress.  Patient able to ambulate in department prior to ED.  Evaluation does not show acute pathology that would require ongoing or additional emergent interventions while in the emergency department or further inpatient treatment.  I have discussed the diagnosis with the patient and answered all questions.  Pain is been managed while in the emergency department and patient has no further complaints prior to discharge.  Patient is comfortable with plan discussed in room and is stable for discharge at this time.  I have discussed strict return precautions for returning to the emergency department.  Patient was encouraged to follow-up with PCP/specialist refer to at discharge.   MDM Rules/Calculators/A&P                           Final Clinical Impression(s) / ED Diagnoses Final diagnoses:  Tick bite, initial encounter    Rx / DC Orders ED Discharge Orders     None       Toney Lizaola A, PA-C 10/28/19 2101    Milton Ferguson, MD 10/30/19 1032

## 2019-10-28 NOTE — ED Triage Notes (Signed)
Pt c/o multiple tick bites. States she was sitting on someone's front porch and felt multiple bites. States she wants someone to check her back for more ticks.

## 2019-11-10 ENCOUNTER — Other Ambulatory Visit: Payer: Self-pay

## 2019-11-10 ENCOUNTER — Emergency Department (HOSPITAL_COMMUNITY): Payer: Medicare Other

## 2019-11-10 ENCOUNTER — Encounter (HOSPITAL_COMMUNITY): Payer: Self-pay

## 2019-11-10 ENCOUNTER — Emergency Department (HOSPITAL_COMMUNITY)
Admission: EM | Admit: 2019-11-10 | Discharge: 2019-11-10 | Disposition: A | Discharge status: Home / Self Care | Payer: Medicare Other | Attending: Emergency Medicine | Admitting: Emergency Medicine

## 2019-11-10 DIAGNOSIS — Z7982 Long term (current) use of aspirin: Secondary | ICD-10-CM | POA: Diagnosis not present

## 2019-11-10 DIAGNOSIS — R600 Localized edema: Secondary | ICD-10-CM | POA: Insufficient documentation

## 2019-11-10 DIAGNOSIS — R091 Pleurisy: Secondary | ICD-10-CM | POA: Diagnosis present

## 2019-11-10 DIAGNOSIS — G8929 Other chronic pain: Secondary | ICD-10-CM | POA: Diagnosis not present

## 2019-11-10 DIAGNOSIS — M25512 Pain in left shoulder: Secondary | ICD-10-CM | POA: Insufficient documentation

## 2019-11-10 DIAGNOSIS — Z79899 Other long term (current) drug therapy: Secondary | ICD-10-CM | POA: Diagnosis not present

## 2019-11-10 DIAGNOSIS — I10 Essential (primary) hypertension: Secondary | ICD-10-CM | POA: Diagnosis not present

## 2019-11-10 LAB — BASIC METABOLIC PANEL
Anion gap: 9 (ref 5–15)
BUN: 12 mg/dL (ref 8–23)
CO2: 25 mmol/L (ref 22–32)
Calcium: 10 mg/dL (ref 8.9–10.3)
Chloride: 103 mmol/L (ref 98–111)
Creatinine, Ser: 0.73 mg/dL (ref 0.44–1.00)
GFR calc Af Amer: >60 mL/min (ref >60)
GFR calc non Af Amer: >60 mL/min (ref >60)
Glucose, Bld: 119 mg/dL — ABNORMAL HIGH (ref 70–99)
Potassium: 4.2 mmol/L (ref 3.5–5.1)
Sodium: 137 mmol/L (ref 135–145)

## 2019-11-10 LAB — TROPONIN I (HIGH SENSITIVITY)
Troponin I (High Sensitivity): <2 ng/L (ref <18)
Troponin I (High Sensitivity): <2 ng/L (ref <18)

## 2019-11-10 LAB — CBC
HCT: 40.1 % (ref 36.0–46.0)
Hemoglobin: 12.6 g/dL (ref 12.0–15.0)
MCH: 26.6 pg (ref 26.0–34.0)
MCHC: 31.4 g/dL (ref 30.0–36.0)
MCV: 84.8 fL (ref 80.0–100.0)
Platelets: 195 10*3/uL (ref 150–400)
RBC: 4.73 MIL/uL (ref 3.87–5.11)
RDW: 15 % (ref 11.5–15.5)
WBC: 6.8 10*3/uL (ref 4.0–10.5)
nRBC: 0 % (ref 0.0–0.2)

## 2019-11-10 MED ORDER — IOHEXOL 350 MG/ML SOLN
100.0000 mL | Freq: Once | INTRAVENOUS | Status: AC | PRN
  Administered 2019-11-10: 100 mL via INTRAVENOUS

## 2019-11-10 NOTE — ED Triage Notes (Addendum)
Pt c/o left upper shoulder pain with radiation to left shoulder arm and back. Denies SOB, dizziness, perspiration.  Pt also c/o decreased ROM in left shoulder. States it feels like rotator cuff pain from past injury.    Took 324 Aspirin PTA.

## 2019-11-10 NOTE — ED Provider Notes (Addendum)
[facility] EMERGENCY DEPARTMENT Provider Note   CSN: [id number] Arrival date & time: 11/10/19  5176     History Chief Complaint  Patient presents with  . Pleurisy    Yvonne Mahoney is a 74 y.o. female.  The history is provided by the patient.  Chest Pain Pain location:  L chest and L lateral chest Pain quality: aching and sharp   Pain severity:  Moderate Onset quality:  Gradual Timing:  Constant Progression:  Waxing and waning Chronicity:  Chronic Context: lifting, movement, raising an arm and at rest   Relieved by:  Certain positions Worsened by:  Movement Ineffective treatments:  None tried Associated symptoms: lower extremity edema (chronic L>R)   Associated symptoms: no abdominal pain, no back pain, no cough, no diaphoresis, no fever, no headache, no palpitations, no shortness of breath, no syncope and no vomiting   Risk factors: hypertension and obesity   Risk factors: no high cholesterol     HPI: A 74 year old patient with a history of hypertension and obesity presents for evaluation of chest pain. Initial onset of pain was more than 6 hours ago. The patient's chest pain is described as heaviness/pressure/tightness and is not worse with exertion. The patient's chest pain is middle- or left-sided, is not well-localized, is not sharp and does not radiate to the arms/jaw/neck. The patient does not complain of nausea and denies diaphoresis. The patient has no history of stroke, has no history of peripheral artery disease, has not smoked in the past 90 days, denies any history of treated diabetes, has no relevant family history of coronary artery disease (first degree relative at less than age 7) and has no history of hypercholesterolemia.   Past Medical History:  Diagnosis Date  . Allergy   . Arthritis   . Cataract   . GERD (gastroesophageal reflux disease)   . Hypertension   . Prediabetes     Patient Active Problem List   Diagnosis Date Noted  .  Hyperparathyroidism, primary (HCC) 08/13/2014  . Hypertension 05/19/2012  . Osteoarthritis 05/19/2012    Past Surgical History:  Procedure Laterality Date  . ABDOMINAL HYSTERECTOMY    . BUNIONECTOMY  1986   both  . CHOLECYSTECTOMY       OB History   No obstetric history on file.     Family History  Problem Relation Age of Onset  . Dementia Mother   . Hypertension Mother   . Diabetes Mother   . Cancer Father   . Cancer Sister   . Hyperlipidemia Sister   . Hypertension Sister   . Thyroid disease Maternal Aunt   . Breast cancer Maternal Aunt   . Diabetes Paternal Aunt     Social History   Tobacco Use  . Smoking status: Never Smoker  . Smokeless tobacco: Never Used  Substance Use Topics  . Alcohol use: No  . Drug use: No    Home Medications Prior to Admission medications   Medication Sig Start Date End Date Taking? Authorizing Provider  amLODipine (NORVASC) 10 MG tablet Take 1 tablet (10 mg total) by mouth daily. 09/17/14  Yes Carmelina Dane, MD  aspirin (ASPIRIN ADULT) 325 MG tablet Take 325 mg by mouth daily as needed for moderate pain.   Yes [provider]  Biotin 1000 MCG tablet Take 1,000 mcg by mouth daily.   Yes [provider]  cholecalciferol (VITAMIN D3) 25 MCG (1000 UNIT) tablet Take 1 capsule by mouth daily.    Yes [provider]  diclofenac sodium (VOLTAREN) 1 % GEL Apply 4 g topically 4 (four) times daily. Patient taking differently: Apply 4 g topically daily as needed (for pain).  07/12/14  Yes Elvina SidleLauenstein, Kurt, MD  metoprolol succinate (TOPROL-XL) 100 MG 24 hr tablet Take 100 mg by mouth daily. 07/28/19  Yes [provider]    Allergies    Methocarbamol, Naproxen, Oxycodone, and Penicillins  Review of Systems   Review of Systems  Constitutional: Negative for chills, diaphoresis and fever.  HENT: Negative for ear pain and sore throat.   Eyes: Negative for pain and visual disturbance.  Respiratory: Negative  for cough and shortness of breath.   Cardiovascular: Positive for chest pain and leg swelling. Negative for palpitations and syncope.  Gastrointestinal: Negative for abdominal pain and vomiting.  Genitourinary: Negative for dysuria and hematuria.  Musculoskeletal: Positive for arthralgias. Negative for back pain.  Skin: Positive for color change (left leg is darker). Negative for rash.  Neurological: Negative for seizures, syncope and headaches.  All other systems reviewed and are negative.   Physical Exam Updated Vital Signs BP 118/61   Pulse 65   Temp 98.2 F (36.8 C) (Oral)   Resp 15   Ht 5\' 4"  (1.626 m)   Wt 95 kg   SpO2 99%   BMI 35.95 kg/m   Physical Exam Vitals and nursing note reviewed. Exam conducted with a chaperone present.  Constitutional:      General: She is not in acute distress.    Appearance: She is well-developed.  HENT:     Head: Normocephalic and atraumatic.  Eyes:     Conjunctiva/sclera: Conjunctivae normal.  Cardiovascular:     Rate and Rhythm: Normal rate and regular rhythm.     Heart sounds: No murmur heard.  No gallop.   Pulmonary:     Effort: Pulmonary effort is normal. No respiratory distress.     Breath sounds: Normal breath sounds. No wheezing, rhonchi or rales.  Chest:     Chest wall: No tenderness.  Abdominal:     Palpations: Abdomen is soft.     Tenderness: There is no abdominal tenderness.  Musculoskeletal:     Cervical back: Neck supple.     Comments: Bilateral 1+ edema to the lower extremities, overall swelling of the left is greater than the right, slightly darker pigmentation of the left leg, no tenderness to palpation.  Pain with range of motion of the left shoulder tenderness palpation about the before meals and glenohumeral joints  Skin:    General: Skin is warm and dry.  Neurological:     Mental Status: She is alert.      ED Results / Procedures / Treatments   Labs (all labs ordered are listed, but only abnormal results  are displayed) Labs Reviewed  BASIC METABOLIC PANEL - Abnormal; Notable for the following components:      Result Value   Glucose, Bld 119 (*)    All other components within normal limits  CBC  TROPONIN I (HIGH SENSITIVITY)  TROPONIN I (HIGH SENSITIVITY)    EKG EKG Interpretation  Date/Time:  Friday November 10 2019 08:44:34 EDT Ventricular Rate:  64 PR Interval:    QRS Duration: 86 QT Interval:  391 QTC Calculation: 404 R Axis:   40 Text Interpretation: Sinus rhythm Confirmed by Cherlynn PerchesKatz, Pj Zehner ([id number]) on 11/10/2019 9:51:10 AM   Radiology CT Angio Chest PE W and/or Wo Contrast  Result Date: 11/10/2019 CLINICAL DATA:  Left shoulder are arm and back  pain. EXAM: CT ANGIOGRAPHY CHEST WITH CONTRAST TECHNIQUE: Multidetector CT imaging of the chest was performed using the standard protocol during bolus administration of intravenous contrast. Multiplanar CT image reconstructions and MIPs were obtained to evaluate the vascular anatomy. CONTRAST:  OMNIPAQUE IOHEXOL 350 MG/ML SOLN COMPARISON:  None. FINDINGS: Cardiovascular: The heart is normal in size. No pericardial effusion. The aorta is normal in caliber. No dissection. Minimal atherosclerotic calcifications at the aortic arch. The branch vessels are patent. Mild calcification at the origin of the left subclavian artery. No obvious coronary artery calcifications. The pulmonary arterial tree is well opacified. No filling defects to suggest pulmonary embolism. Mediastinum/Nodes: No mediastinal or hilar mass or adenopathy. The esophagus is grossly normal. Lungs/Pleura: The lungs are clear of an acute process. No edema, infiltrates or effusions. No worrisome pulmonary lesions. Upper Abdomen: No significant upper abdominal findings. Musculoskeletal: No breast masses, supraclavicular or axillary adenopathy. The bony thorax is intact. Review of the MIP images confirms the above findings. IMPRESSION: 1. No CT findings for pulmonary embolism. 2. Normal  thoracic aorta. 3. No acute pulmonary findings or worrisome pulmonary lesions. 4. Minimal aortic atherosclerosis. Aortic Atherosclerosis (ICD10-I70.0). Aortic Atherosclerosis (ICD10-I70.0). Electronically Signed   By: Rudie Meyer M.D.   On: 11/10/2019 12:53    Procedures Procedures (including critical care time)  Medications Ordered in ED Medications  iohexol (OMNIPAQUE) 350 MG/ML injection 100 mL (100 mLs Intravenous Contrast Given 11/10/19 1217)    ED Course  I have reviewed the triage vital signs and the nursing notes.  Pertinent labs & imaging results that were available during my care of the patient were reviewed by me and considered in my medical decision making (see chart for details).    MDM Rules/Calculators/A&P HEAR Score: 24                        74 year old patient comes with chronic shoulder pain that was worse yesterday and felt different, was a pressure on her chest and shoulder.  Hear score of 4.  Will get troponins.  Also has a sharp component to the chest pain/shoulder pain, likely musculoskeletal however with her chronic lower extremity swelling and worsening on the left we will get pulmonary embolism studies.  She will get baseline labs CBC EKG as well.  ECG reviewed by me shows, no acute ischemic changes, interval abnormalities or arrhythmia.  First troponin is undetectable.  Based on her chronicity of symptoms do not need a second troponin.  CTA for PE study after reviewed by myself and radiology shows no acute PE or other significant findings.  She is safe for discharge home.  Other laboratory studies are unremarkable.  Her pain does sound musculoskeletal in origin, I do not think this is dissection as she has strong pulses normal vital signs and inconsistent story for dissection.  No infectious signs or symptoms either.She agrees that these she thinks is musculoskeletal and does not need further work-up now.  She is invited to come back anytime for further evaluation  recommend outpatient follow-up with her orthopedist.  As for the lower extremity swelling, this is an acute worsening of a chronic problem.  When this has happened in the past it has been related to varicose veins.  Is intermittently changing super do not think the likelihood of DVT is as high, I recommend she will follow this up with her primary doctor and her vein specialist as well as come back at any point for concerns or changes.  I discussed the option of ultrasound with the patient she declined further work-up at this point.  Pt is safe for DC home with outpatient follow up. The patient  agrees with the plan and has no other questions or concerns.     Final Clinical Impression(s) / ED Diagnoses Final diagnoses:  Left shoulder pain, unspecified chronicity    Rx / DC Orders ED Discharge Orders    None       Sabino Donovan, MD 11/10/19 1312    Sabino Donovan, MD 11/10/19 1340

## 2019-11-14 ENCOUNTER — Other Ambulatory Visit: Payer: Self-pay | Admitting: Family Medicine

## 2019-11-14 DIAGNOSIS — Z1231 Encounter for screening mammogram for malignant neoplasm of breast: Secondary | ICD-10-CM

## 2019-11-24 ENCOUNTER — Ambulatory Visit
Admission: RE | Admit: 2019-11-24 | Discharge: 2019-11-24 | Disposition: A | Discharge status: Home / Self Care | Payer: Medicare Other | Source: Ambulatory Visit | Attending: Family Medicine | Admitting: Family Medicine

## 2019-11-24 ENCOUNTER — Other Ambulatory Visit: Payer: Self-pay

## 2019-11-24 DIAGNOSIS — Z1231 Encounter for screening mammogram for malignant neoplasm of breast: Secondary | ICD-10-CM

## 2020-09-27 ENCOUNTER — Emergency Department (HOSPITAL_COMMUNITY)
Admission: EM | Admit: 2020-09-27 | Discharge: 2020-09-27 | Disposition: A | Discharge status: Home / Self Care | Payer: Medicare Other | Attending: Emergency Medicine | Admitting: Emergency Medicine

## 2020-09-27 ENCOUNTER — Other Ambulatory Visit: Payer: Self-pay

## 2020-09-27 ENCOUNTER — Encounter (HOSPITAL_COMMUNITY): Payer: Self-pay | Admitting: *Deleted

## 2020-09-27 DIAGNOSIS — I10 Essential (primary) hypertension: Secondary | ICD-10-CM | POA: Insufficient documentation

## 2020-09-27 DIAGNOSIS — E039 Hypothyroidism, unspecified: Secondary | ICD-10-CM | POA: Diagnosis not present

## 2020-09-27 DIAGNOSIS — Z79899 Other long term (current) drug therapy: Secondary | ICD-10-CM | POA: Diagnosis not present

## 2020-09-27 DIAGNOSIS — M79605 Pain in left leg: Secondary | ICD-10-CM

## 2020-09-27 DIAGNOSIS — R2242 Localized swelling, mass and lump, left lower limb: Secondary | ICD-10-CM | POA: Insufficient documentation

## 2020-09-27 NOTE — ED Triage Notes (Signed)
States she was advised to go to [facility] or come here for ultrasound. States she has swelling in left knee

## 2020-09-27 NOTE — Discharge Instructions (Addendum)
Please arrive 15 minutes early for your ultrasound appointment.  Please elevate your legs and wear compression stockings.  Follow-up with your vascular doctor.  If your ultrasound is positive for DVT he will be brought into the ER for evaluation by one of our providers.

## 2020-09-27 NOTE — ED Provider Notes (Signed)
[facility] EMERGENCY DEPARTMENT Provider Note   CSN: [id number] Arrival date & time: 09/27/20  1728     History Chief Complaint  Patient presents with  . Leg Pain    Yvonne Mahoney is a 75 y.o. female.  HPI Patient is a 75 year old female with past medical history significant for hypertension, allergies, reflux  Patient is presented today with left leg pain and swelling.  She states that she was recently traveling and drove 7 hours and 1 day although she states this was broken up somewhat by gas drops and food stops.  She drove to [place].  She states that after she arrived in [place] she has leg swelling and discomfort.  She states that it is not abnormal for her to get some swelling.  She was seen by her orthopedist office and recommended to have an ultrasound done.  At the time of this visit to the ER she has arrived after vascular ultrasounds are no longer available.  She denies any chest pain or shortness of breath.  No lightheadedness or dizziness. She states that her symptoms have tremendously improved since her trip.  She feels that it helped to elevate her legs.    Past Medical History:  Diagnosis Date  . Allergy   . Arthritis   . Cataract   . GERD (gastroesophageal reflux disease)   . Hypertension   . Prediabetes     Patient Active Problem List   Diagnosis Date Noted  . Hyperparathyroidism, primary (HCC) 08/13/2014  . Hypertension 05/19/2012  . Osteoarthritis 05/19/2012    Past Surgical History:  Procedure Laterality Date  . ABDOMINAL HYSTERECTOMY    . BUNIONECTOMY  1986   both  . CHOLECYSTECTOMY       OB History   No obstetric history on file.     Family History  Problem Relation Age of Onset  . Dementia Mother   . Hypertension Mother   . Diabetes Mother   . Cancer Father   . Cancer Sister   . Hyperlipidemia Sister   . Hypertension Sister   . Thyroid disease Maternal Aunt   . Breast cancer Maternal Aunt   . Diabetes Paternal Aunt      Social History   Tobacco Use  . Smoking status: Never Smoker  . Smokeless tobacco: Never Used  Substance Use Topics  . Alcohol use: No  . Drug use: No    Home Medications Prior to Admission medications   Medication Sig Start Date End Date Taking? Authorizing Provider  amLODipine (NORVASC) 10 MG tablet Take 1 tablet (10 mg total) by mouth daily. 09/17/14   Carmelina Dane, MD  aspirin (ASPIRIN ADULT) 325 MG tablet Take 325 mg by mouth daily as needed for moderate pain.    [provider]  Biotin 1000 MCG tablet Take 1,000 mcg by mouth daily.    [provider]  cholecalciferol (VITAMIN D3) 25 MCG (1000 UNIT) tablet Take 1 capsule by mouth daily.     [provider]  diclofenac sodium (VOLTAREN) 1 % GEL Apply 4 g topically 4 (four) times daily. Patient taking differently: Apply 4 g topically daily as needed (for pain).  07/12/14   [name], MD  metoprolol succinate (TOPROL-XL) 100 MG 24 hr tablet Take 100 mg by mouth daily. 07/28/19   [provider]    Allergies    Methocarbamol, Naproxen, Oxycodone, and Penicillins  Review of Systems   Review of Systems  Constitutional: Negative for chills and fever.  HENT: Negative for congestion.   Eyes: Negative for pain.  Respiratory: Negative for cough and shortness of breath.   Cardiovascular: Negative for chest pain and leg swelling.  Gastrointestinal: Negative for abdominal pain and vomiting.  Genitourinary: Negative for dysuria.  Musculoskeletal: Negative for myalgias.       Left lower extremity swelling  Skin: Negative for rash.  Neurological: Negative for dizziness and headaches.    Physical Exam Updated Vital Signs BP 137/70   Pulse (!) 59   Temp 97.9 F (36.6 C) (Oral)   Resp 16   SpO2 96%   Physical Exam Vitals and nursing note reviewed.  Constitutional:      General: She is not in acute distress.    Comments: Pleasant cooperative 75 year old female no acute distress  sitting comfortably in bed.  Able answer questions appropriate follow commands.  Speaking full sentences.  HENT:     Head: Normocephalic and atraumatic.     Nose: Nose normal.  Eyes:     General: No scleral icterus. Cardiovascular:     Rate and Rhythm: Normal rate and regular rhythm.     Pulses: Normal pulses.     Heart sounds: Normal heart sounds.     Comments: Bilateral DP PT pulses 3+ Pulmonary:     Effort: Pulmonary effort is normal. No respiratory distress.     Breath sounds: No wheezing.  Abdominal:     Palpations: Abdomen is soft.     Tenderness: There is no abdominal tenderness.  Musculoskeletal:     Cervical back: Normal range of motion.     Right lower leg: No edema.     Left lower leg: No edema.     Comments: No asymmetric lower extremity edema.  No significant calf tenderness.  No cellulitis redness warmth to touch.  Skin:    General: Skin is warm and dry.     Capillary Refill: Capillary refill takes less than 2 seconds.  Neurological:     Mental Status: She is alert. Mental status is at baseline.  Psychiatric:        Mood and Affect: Mood normal.        Behavior: Behavior normal.     ED Results / Procedures / Treatments   Labs (all labs ordered are listed, but only abnormal results are displayed) Labs Reviewed - No data to display  EKG None  Radiology No results found.  Procedures Procedures   Medications Ordered in ED Medications - No data to display  ED Course  I have reviewed the triage vital signs and the nursing notes.  Pertinent labs & imaging results that were available during my care of the patient were reviewed by me and considered in my medical decision making (see chart for details).    MDM Rules/Calculators/A&P                          Patient is well-appearing 75 year old female here for lower extremity ultrasound.  This is not available at this time.  Will place outpatient order that she can have this scheduled by nurse.  She was  agreeable to this plan.  She has no chest pain or shortness of breath and I have low suspicion for pulmonary embolism and relatively low suspicion for DVT.  Suspect some lower extremity swelling from the long trip.  However given that it was asymmetric even though has resolved does merit DVT study.  She understands return precautions.  I briefly discussed this  case with my attending physician prior to discharging patient.  There is no evident cellulitis and patient has good bilateral dorsalis pedis and posterior tibial pulses.  Final Clinical Impression(s) / ED Diagnoses Final diagnoses:  Left leg pain    Rx / DC Orders ED Discharge Orders         Ordered    US Venous Img Lower Unilateral Left        09/27/20 1852           Solon Augusta [place], [place] 09/28/20 2325    Bethann Berkshire, MD 09/29/20 1101

## 2020-09-27 NOTE — ED Notes (Signed)
Pt states she is not waiting for paperwork and she is ready to leave. She was only waiting for an ultrasound appointment and she will just get a call with that appointment date. She has been out dealing with the doctors for this all day and she is tired of waiting. Pt signed discharge however did not wait for paperwork.

## 2020-09-27 NOTE — ED Provider Notes (Incomplete)
[facility] EMERGENCY DEPARTMENT Provider Note   CSN: [id number] Arrival date & time: 09/27/20  1728     History Chief Complaint  Patient presents with  . Leg Pain    Yvonne Mahoney is a 75 y.o. female.  HPI     Past Medical History:  Diagnosis Date  . Allergy   . Arthritis   . Cataract   . GERD (gastroesophageal reflux disease)   . Hypertension   . Prediabetes     Patient Active Problem List   Diagnosis Date Noted  . Hyperparathyroidism, primary (HCC) 08/13/2014  . Hypertension 05/19/2012  . Osteoarthritis 05/19/2012    Past Surgical History:  Procedure Laterality Date  . ABDOMINAL HYSTERECTOMY    . BUNIONECTOMY  1986   both  . CHOLECYSTECTOMY       OB History   No obstetric history on file.     Family History  Problem Relation Age of Onset  . Dementia Mother   . Hypertension Mother   . Diabetes Mother   . Cancer Father   . Cancer Sister   . Hyperlipidemia Sister   . Hypertension Sister   . Thyroid disease Maternal Aunt   . Breast cancer Maternal Aunt   . Diabetes Paternal Aunt     Social History   Tobacco Use  . Smoking status: Never Smoker  . Smokeless tobacco: Never Used  Substance Use Topics  . Alcohol use: No  . Drug use: No    Home Medications Prior to Admission medications   Medication Sig Start Date End Date Taking? Authorizing Provider  amLODipine (NORVASC) 10 MG tablet Take 1 tablet (10 mg total) by mouth daily. 09/17/14   Carmelina Dane, MD  aspirin (ASPIRIN ADULT) 325 MG tablet Take 325 mg by mouth daily as needed for moderate pain.    [provider]  Biotin 1000 MCG tablet Take 1,000 mcg by mouth daily.    [provider]  cholecalciferol (VITAMIN D3) 25 MCG (1000 UNIT) tablet Take 1 capsule by mouth daily.     [provider]  diclofenac sodium (VOLTAREN) 1 % GEL Apply 4 g topically 4 (four) times daily. Patient taking differently: Apply 4 g topically daily as needed (for pain).   07/12/14   [name], MD  metoprolol succinate (TOPROL-XL) 100 MG 24 hr tablet Take 100 mg by mouth daily. 07/28/19   [provider]    Allergies    Methocarbamol, Naproxen, Oxycodone, and Penicillins  Review of Systems   Review of Systems  Physical Exam Updated Vital Signs BP 137/70   Pulse (!) 59   Temp 97.9 F (36.6 C) (Oral)   Resp 16   SpO2 96%   Physical Exam  ED Results / Procedures / Treatments   Labs (all labs ordered are listed, but only abnormal results are displayed) Labs Reviewed - No data to display  EKG None  Radiology No results found.  Procedures Procedures {Remember to document critical care time when appropriate:1}  Medications Ordered in ED Medications - No data to display  ED Course  I have reviewed the triage vital signs and the nursing notes.  Pertinent labs & imaging results that were available during my care of the patient were reviewed by me and considered in my medical decision making (see chart for details).    MDM Rules/Calculators/A&P                          ***  Final Clinical Impression(s) / ED Diagnoses Final diagnoses:  None    Rx / DC Orders ED Discharge Orders    None

## 2021-03-01 ENCOUNTER — Emergency Department (HOSPITAL_COMMUNITY): Payer: Medicare Other

## 2021-03-01 ENCOUNTER — Emergency Department (HOSPITAL_COMMUNITY)
Admission: EM | Admit: 2021-03-01 | Discharge: 2021-03-01 | Disposition: A | Discharge status: Home / Self Care | Payer: Medicare Other | Attending: Emergency Medicine | Admitting: Emergency Medicine

## 2021-03-01 DIAGNOSIS — M549 Dorsalgia, unspecified: Secondary | ICD-10-CM | POA: Insufficient documentation

## 2021-03-01 DIAGNOSIS — N200 Calculus of kidney: Secondary | ICD-10-CM | POA: Diagnosis not present

## 2021-03-01 DIAGNOSIS — I10 Essential (primary) hypertension: Secondary | ICD-10-CM | POA: Insufficient documentation

## 2021-03-01 DIAGNOSIS — M48061 Spinal stenosis, lumbar region without neurogenic claudication: Secondary | ICD-10-CM | POA: Diagnosis not present

## 2021-03-01 DIAGNOSIS — M5416 Radiculopathy, lumbar region: Secondary | ICD-10-CM | POA: Insufficient documentation

## 2021-03-01 DIAGNOSIS — R079 Chest pain, unspecified: Secondary | ICD-10-CM | POA: Diagnosis present

## 2021-03-01 HISTORY — DX: Calculus of kidney: N20.0

## 2021-03-01 LAB — HEPATIC FUNCTION PANEL
ALT: 28 U/L (ref 0–44)
AST: 28 U/L (ref 15–41)
Albumin: 3.9 g/dL (ref 3.5–5.0)
Alkaline Phosphatase: 70 U/L (ref 38–126)
Bilirubin, Direct: <0.1 mg/dL (ref 0.0–0.2)
Total Bilirubin: 0.6 mg/dL (ref 0.3–1.2)
Total Protein: 7.7 g/dL (ref 6.5–8.1)

## 2021-03-01 LAB — BASIC METABOLIC PANEL
Anion gap: 6 (ref 5–15)
BUN: 11 mg/dL (ref 8–23)
CO2: 27 mmol/L (ref 22–32)
Calcium: 10.8 mg/dL — ABNORMAL HIGH (ref 8.9–10.3)
Chloride: 100 mmol/L (ref 98–111)
Creatinine, Ser: 0.74 mg/dL (ref 0.44–1.00)
GFR, Estimated: >60 mL/min (ref >60)
Glucose, Bld: 99 mg/dL (ref 70–99)
Potassium: 4.7 mmol/L (ref 3.5–5.1)
Sodium: 133 mmol/L — ABNORMAL LOW (ref 135–145)

## 2021-03-01 LAB — URINALYSIS, ROUTINE W REFLEX MICROSCOPIC
Bilirubin Urine: NEGATIVE
Glucose, UA: NEGATIVE mg/dL
Ketones, ur: NEGATIVE mg/dL
Nitrite: NEGATIVE
Protein, ur: NEGATIVE mg/dL
Specific Gravity, Urine: 1.008 (ref 1.005–1.030)
pH: 7 (ref 5.0–8.0)

## 2021-03-01 LAB — CBC
HCT: 40.1 % (ref 36.0–46.0)
Hemoglobin: 12.5 g/dL (ref 12.0–15.0)
MCH: 26.2 pg (ref 26.0–34.0)
MCHC: 31.2 g/dL (ref 30.0–36.0)
MCV: 84.1 fL (ref 80.0–100.0)
Platelets: 243 10*3/uL (ref 150–400)
RBC: 4.77 MIL/uL (ref 3.87–5.11)
RDW: 14.5 % (ref 11.5–15.5)
WBC: 7.2 10*3/uL (ref 4.0–10.5)
nRBC: 0 % (ref 0.0–0.2)

## 2021-03-01 LAB — LIPASE, BLOOD: Lipase: 24 U/L (ref 11–51)

## 2021-03-01 LAB — TROPONIN I (HIGH SENSITIVITY): Troponin I (High Sensitivity): 3 ng/L (ref <18)

## 2021-03-01 MED ORDER — ALUM & MAG HYDROXIDE-SIMETH 200-200-20 MG/5ML PO SUSP
30.0000 mL | Freq: Once | ORAL | Status: AC
  Administered 2021-03-01: 30 mL via ORAL
  Filled 2021-03-01: qty 30

## 2021-03-01 MED ORDER — TAMSULOSIN HCL 0.4 MG PO CAPS: 0.4000 mg | ORAL_CAPSULE | Freq: Every day | ORAL | 0 refills | Status: AC

## 2021-03-01 MED ORDER — ONDANSETRON 4 MG PO TBDP: 4.0000 mg | ORAL_TABLET | Freq: Three times a day (TID) | ORAL | 0 refills | Status: AC | PRN

## 2021-03-01 MED ORDER — IOHEXOL 350 MG/ML SOLN
100.0000 mL | Freq: Once | INTRAVENOUS | Status: AC | PRN
  Administered 2021-03-01: 100 mL via INTRAVENOUS

## 2021-03-01 MED ORDER — PREDNISONE 10 MG (21) PO TBPK: ORAL_TABLET | Freq: Every day | ORAL | 0 refills | Status: AC

## 2021-03-01 NOTE — ED Notes (Signed)
Discharge instructions, follow up care, and prescriptions reviewed and discussed. Pt verbalized understanding.

## 2021-03-01 NOTE — ED Triage Notes (Signed)
Pt here d/t CP X4 days. Increased pain on exertion and while laying down. Pt c/o of bloating and gas.

## 2021-03-01 NOTE — ED Provider Notes (Signed)
Emergency Medicine Provider Triage Evaluation Note  Yvonne Mahoney [place] , a 75 y.o. female  was evaluated in triage.  Pt has multiple plaints including chest pain, abdominal bloating, and lower back pain.  She reports she has had constant substernal chest pain that is nonradiating for the past 3 to 4 days.  She mentions the pain worsens whenever she changes position from sitting to lying.  She denies any orthopnea.  Denies any shortness of breath, nausea, vomiting, diaphoresis.  She reports abdominal distention, but not abdominal pain.  For her leg, she mentions the pain is in her lower right back and radiates down her right leg into her knee.  Denies any traumas, falls, MVC's recently   Review of Systems  Positive: Chest pain, abdominal distention, low back pain Negative: Shortness of breath, abdominal pain, nausea, vomiting, diarrhea, constipation  Physical Exam  BP (!) 145/72 (BP Location: Left Arm)   Pulse 65   Temp 98.4 F (36.9 C) (Oral)   Resp 20   SpO2 95%  Gen:   Awake, no distress   Resp:  Normal effort  MSK:   Moves extremities without difficulty  Other:    Medical Decision Making  Medically screening exam initiated at 1:59 PM.  Appropriate orders placed.  Yvonne Mahoney was informed that the remainder of the evaluation will be completed by another provider, this initial triage assessment does not replace that evaluation, and the importance of remaining in the ED until their evaluation is complete.  Chest pain order set   Achille Rich, PA-C 03/01/21 1403    Jacalyn Lefevre, MD 03/01/21 1539

## 2021-03-01 NOTE — ED Notes (Signed)
Patient transported to CT 

## 2021-03-01 NOTE — ED Provider Notes (Signed)
[facility] EMERGENCY DEPARTMENT Provider Note   CSN: [id number] Arrival date & time: 03/01/21  1348     History Chief Complaint  Patient presents with   Chest Pain    Yvonne Mahoney is a 75 y.o. female.  Pt presents to the ED today with cp and abdominal pain.  Pt said she has pain behind her sternum.  She also has some upper abd pain and some bloating.  Pain is worse with movement.  No sob.  No n/v.  She also has pain in her right leg going down into her right knee.       Past Medical History:  Diagnosis Date   Allergy    Arthritis    Cataract    GERD (gastroesophageal reflux disease)    Hypertension    Prediabetes     Patient Active Problem List   Diagnosis Date Noted   Hyperparathyroidism, primary (HCC) 08/13/2014   Hypertension 05/19/2012   Osteoarthritis 05/19/2012    Past Surgical History:  Procedure Laterality Date   ABDOMINAL HYSTERECTOMY     BUNIONECTOMY  1986   both   CHOLECYSTECTOMY       OB History   No obstetric history on file.     Family History  Problem Relation Age of Onset   Dementia Mother    Hypertension Mother    Diabetes Mother    Cancer Father    Cancer Sister    Hyperlipidemia Sister    Hypertension Sister    Thyroid disease Maternal Aunt    Breast cancer Maternal Aunt    Diabetes Paternal Aunt     Social History   Tobacco Use   Smoking status: Never   Smokeless tobacco: Never  Substance Use Topics   Alcohol use: No   Drug use: No    Home Medications Prior to Admission medications   Medication Sig Start Date End Date Taking? Authorizing Provider  acetaminophen (TYLENOL) 500 MG tablet Take 500 mg by mouth every 6 (six) hours as needed for mild pain.   Yes [provider]  amLODipine (NORVASC) 10 MG tablet Take 1 tablet (10 mg total) by mouth daily. 09/17/14  Yes Carmelina Dane, MD  aspirin 325 MG tablet Take 325 mg by mouth daily as needed for moderate pain.   Yes [provider]  metoprolol succinate (TOPROL-XL) 100 MG 24 hr tablet Take 100 mg by mouth daily. 07/28/19  Yes [provider]  ondansetron (ZOFRAN ODT) 4 MG disintegrating tablet Take 1 tablet (4 mg total) by mouth every 8 (eight) hours as needed for nausea or vomiting. 03/01/21  Yes Jacalyn Lefevre, MD  predniSONE (STERAPRED UNI-PAK 21 TAB) 10 MG (21) TBPK tablet Take by mouth daily. Take 6 tabs by mouth daily  for 2 days, then 5 tabs for 2 days, then 4 tabs for 2 days, then 3 tabs for 2 days, 2 tabs for 2 days, then 1 tab by mouth daily for 2 days 03/01/21  Yes Jacalyn Lefevre, MD  tamsulosin (FLOMAX) 0.4 MG CAPS capsule Take 1 capsule (0.4 mg total) by mouth daily. 03/01/21  Yes Jacalyn Lefevre, MD  diclofenac sodium (VOLTAREN) 1 % GEL Apply 4 g topically 4 (four) times daily. Patient not taking: Reported on 03/01/2021 07/12/14   [name], MD    Allergies    Methocarbamol, Naproxen, Oxycodone, and Penicillins  Review of Systems   Review of Systems  Cardiovascular:  Positive for chest pain.  Gastrointestinal:  Positive for  abdominal pain.  All other systems reviewed and are negative.  Physical Exam Updated Vital Signs BP 118/68 (BP Location: Left Arm)   Pulse 68   Temp 98.1 F (36.7 C) (Oral)   Resp 20   SpO2 97%   Physical Exam Vitals and nursing note reviewed.  Constitutional:      Appearance: She is well-developed.  HENT:     Head: Normocephalic and atraumatic.  Eyes:     Extraocular Movements: Extraocular movements intact.     Pupils: Pupils are equal, round, and reactive to light.  Cardiovascular:     Rate and Rhythm: Normal rate and regular rhythm.     Heart sounds: Normal heart sounds.  Pulmonary:     Effort: Pulmonary effort is normal.     Breath sounds: Normal breath sounds.  Abdominal:     General: Bowel sounds are normal.     Palpations: Abdomen is soft.     Tenderness: There is abdominal tenderness in the epigastric area.  Musculoskeletal:         General: Normal range of motion.     Cervical back: Normal range of motion and neck supple.  Skin:    General: Skin is warm.     Capillary Refill: Capillary refill takes less than 2 seconds.  Neurological:     General: No focal deficit present.     Mental Status: She is alert and oriented to person, place, and time.  Psychiatric:        Mood and Affect: Mood normal.        Behavior: Behavior normal.    ED Results / Procedures / Treatments   Labs (all labs ordered are listed, but only abnormal results are displayed) Labs Reviewed  BASIC METABOLIC PANEL - Abnormal; Notable for the following components:      Result Value   Sodium 133 (*)    Calcium 10.8 (*)    All other components within normal limits  URINALYSIS, ROUTINE W REFLEX MICROSCOPIC - Abnormal; Notable for the following components:   Color, Urine STRAW (*)    Hgb urine dipstick MODERATE (*)    Leukocytes,Ua TRACE (*)    Bacteria, UA RARE (*)    All other components within normal limits  CBC  HEPATIC FUNCTION PANEL  LIPASE, BLOOD  TROPONIN I (HIGH SENSITIVITY)  TROPONIN I (HIGH SENSITIVITY)    EKG EKG Interpretation  Date/Time:  Saturday March 01 2021 13:56:21 EDT Ventricular Rate:  65 PR Interval:  212 QRS Duration: 84 QT Interval:  374 QTC Calculation: 388 R Axis:   40 Text Interpretation: Sinus rhythm with 1st degree A-V block Otherwise normal ECG No significant change since last tracing Confirmed by Jacalyn Lefevre [phone]) on 03/01/2021 2:05:49 PM  Radiology DG Chest 2 View  Result Date: 03/01/2021 CLINICAL DATA:  Pt here d/t CP X4 days. Increased pain on exertion and while laying down. Pt c/o of bloating and gas. (per er triage notes) Best obtainable picture due to pt condition EXAM: CHEST - 2 VIEW COMPARISON:  CT 11/10/2019 FINDINGS: Lungs are clear. Heart size and mediastinal contours are within normal limits. Aortic Atherosclerosis (ICD10-170.0). No effusion.  No pneumothorax. Anterior vertebral  endplate spurring at multiple levels in the mid and lower thoracic spine. Cholecystectomy clips. IMPRESSION: No acute cardiopulmonary disease. Electronically Signed   By: Corlis Leak M.D.   On: 03/01/2021 14:35    CT abd/pelvis IMPRESSION: 1. Early/partially obstructive 1.5 cm right nephrolithiasis centered within the renal pelvis with likely superimposed infection.  Correlate with urinalysis. Otherwise nonobstructive subcentimeter right nephrolithiasis also noted. 2. Couple of indeterminate 1 cm and 1.4 cm right renal lesions. Recommend MRI renal protocol further evaluation. When the patient is clinically stable and able to follow directions and hold their breath (preferably as an outpatient) further evaluation with dedicated abdominal MRI should be considered. 3. 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 4. Hepatic steatosis. 5. Severe L5-S1 degenerative changes with right osseous neural foraminal stenosis.   Procedures Procedures   Medications Ordered in ED Medications  alum & mag hydroxide-simeth (MAALOX/MYLANTA) 200-200-20 MG/5ML suspension 30 mL (30 mLs Oral Given 03/01/21 1500)  iohexol (OMNIPAQUE) 350 MG/ML injection 100 mL (100 mLs Intravenous Contrast Given 03/01/21 1730)    ED Course  I have reviewed the triage vital signs and the nursing notes.  Pertinent labs & imaging results that were available during my care of the patient were reviewed by me and considered in my medical decision making (see chart for details).    MDM Rules/Calculators/A&P                           Pt feels well.  Cardiac eval neg.  Pt does have some lumbar spinal stenosis and a partially obstructing right renal stone.  Pt is stable for d/c.  She is to return if worse.  F/u with pcp/urology.  Final Clinical Impression(s) / ED Diagnoses Final diagnoses:  Back pain  Spinal stenosis of lumbar region without neurogenic claudication  Right renal stone  Lumbar radiculopathy     Rx / DC Orders ED Discharge Orders          Ordered    predniSONE (STERAPRED UNI-PAK 21 TAB) 10 MG (21) TBPK tablet  Daily        03/01/21 1939    tamsulosin (FLOMAX) 0.4 MG CAPS capsule  Daily        03/01/21 1939    ondansetron (ZOFRAN ODT) 4 MG disintegrating tablet  Every 8 hours PRN        03/01/21 1939             Jacalyn Lefevre, MD 03/01/21 2005

## 2021-03-04 ENCOUNTER — Other Ambulatory Visit: Payer: Self-pay | Admitting: Urology

## 2021-03-06 ENCOUNTER — Encounter (HOSPITAL_BASED_OUTPATIENT_CLINIC_OR_DEPARTMENT_OTHER): Payer: Self-pay | Admitting: Urology

## 2021-03-06 ENCOUNTER — Other Ambulatory Visit: Payer: Self-pay

## 2021-03-06 NOTE — Progress Notes (Signed)
Spoke w/ via phone for pre-op interview---Patient Lab needs dos----none               Lab results------03/01/21 CBC & CMP in Epic, 03/01/21 EKG in chart & Epic COVID test -----patient states asymptomatic no test needed Arrive at -------1115 on 03/12/21 NPO after MN NO Solid Food.  Clear liquids from MN until---1015 Med rec completed Medications to take morning of surgery -----Amlodipine, Metoprolol, Prednisone, Tamsulosin, Zofran prn Diabetic medication ----- n/a Patient instructed no nail polish to be worn day of surgery Patient instructed to bring photo id and insurance card day of surgery Patient aware to have Driver (ride ) / caregiver    for 24 hours after surgery  - ex-husband Dimas Aguas Patient Special Instructions -----Patient was uncertain if she had any more allergies.She said she had a list somewhere but couldn't find it now. I instructed her to bring list DOS if she finds it. Pre-Op special Instructions -----none Patient verbalized understanding of instructions that were given at this phone interview. Patient denies shortness of breath, chest pain, fever, cough at this phone interview.    Patient visited [facility] ED on 03/01/21 (in Epic) for chest, back and abdominal pain. Cardiac workup was negative. Patient was found to have lumbar stenosis and a kidney stone. She was prescribed Zofran, Tamsulosin and Prednisone (STERAPRED UNI-PAK 21 TAB). During pre-op phone call, patient told me that she took prednisone for two days (she only took one tablet per day) and then stopped. She also said that she stopped taking her blood pressure medication when she took the prednisone. I instructed her not to stop taking her blood pressure medication whether she was taking the prednisone or not.She stated that she never took the Tamsulosin due to reading the possible side effects. She seemed very confused about why she was given these medications. I instructed her to call the number on her discharge  instructions from the ED visit for clarification on why each medication was prescribed and if she should start taking the prednisone now. Patient also stated that she sometimes panics if an oxygen masked is placed over her mouth and nose.

## 2021-03-12 ENCOUNTER — Ambulatory Visit (HOSPITAL_BASED_OUTPATIENT_CLINIC_OR_DEPARTMENT_OTHER)
Admission: RE | Admit: 2021-03-12 | Discharge: 2021-03-12 | Disposition: A | Discharge status: Home / Self Care | Payer: Medicare Other | Attending: Urology | Admitting: Urology

## 2021-03-12 ENCOUNTER — Other Ambulatory Visit: Payer: Self-pay

## 2021-03-12 ENCOUNTER — Encounter (HOSPITAL_BASED_OUTPATIENT_CLINIC_OR_DEPARTMENT_OTHER): Payer: Self-pay | Admitting: Urology

## 2021-03-12 ENCOUNTER — Ambulatory Visit (HOSPITAL_BASED_OUTPATIENT_CLINIC_OR_DEPARTMENT_OTHER): Payer: Medicare Other | Admitting: Anesthesiology

## 2021-03-12 ENCOUNTER — Encounter (HOSPITAL_BASED_OUTPATIENT_CLINIC_OR_DEPARTMENT_OTHER)
Admission: RE | Disposition: A | Discharge status: Home / Self Care | Payer: Self-pay | Source: Home / Self Care | Attending: Urology

## 2021-03-12 DIAGNOSIS — N200 Calculus of kidney: Secondary | ICD-10-CM | POA: Diagnosis not present

## 2021-03-12 DIAGNOSIS — Z79899 Other long term (current) drug therapy: Secondary | ICD-10-CM | POA: Insufficient documentation

## 2021-03-12 DIAGNOSIS — Z885 Allergy status to narcotic agent status: Secondary | ICD-10-CM | POA: Diagnosis not present

## 2021-03-12 DIAGNOSIS — Z7952 Long term (current) use of systemic steroids: Secondary | ICD-10-CM | POA: Diagnosis not present

## 2021-03-12 DIAGNOSIS — Z7982 Long term (current) use of aspirin: Secondary | ICD-10-CM | POA: Insufficient documentation

## 2021-03-12 DIAGNOSIS — Z886 Allergy status to analgesic agent status: Secondary | ICD-10-CM | POA: Insufficient documentation

## 2021-03-12 DIAGNOSIS — Z880 Allergy status to penicillin: Secondary | ICD-10-CM | POA: Insufficient documentation

## 2021-03-12 DIAGNOSIS — Z888 Allergy status to other drugs, medicaments and biological substances status: Secondary | ICD-10-CM | POA: Insufficient documentation

## 2021-03-12 HISTORY — DX: Presence of dental prosthetic device (complete) (partial): Z97.2

## 2021-03-12 HISTORY — DX: Spinal stenosis, site unspecified: M48.00

## 2021-03-12 HISTORY — PX: CYSTOSCOPY/URETEROSCOPY/HOLMIUM LASER/STENT PLACEMENT: SHX6546

## 2021-03-12 HISTORY — DX: Presence of spectacles and contact lenses: Z97.3

## 2021-03-12 SURGERY — CYSTOSCOPY/URETEROSCOPY/HOLMIUM LASER/STENT PLACEMENT
Anesthesia: General | Site: Pelvis | Laterality: Right

## 2021-03-12 MED ORDER — LACTATED RINGERS IV SOLN: INTRAVENOUS | Status: DC

## 2021-03-12 MED ORDER — CIPROFLOXACIN IN D5W 400 MG/200ML IV SOLN
400.0000 mg | Freq: Once | INTRAVENOUS | Status: AC
  Administered 2021-03-12: 400 mg via INTRAVENOUS

## 2021-03-12 MED ORDER — IOHEXOL 300 MG/ML  SOLN
INTRAMUSCULAR | Status: DC | PRN
  Administered 2021-03-12: 7 mL via URETHRAL

## 2021-03-12 MED ORDER — FENTANYL CITRATE (PF) 100 MCG/2ML IJ SOLN
INTRAMUSCULAR | Status: DC | PRN
  Administered 2021-03-12: 50 ug via INTRAVENOUS

## 2021-03-12 MED ORDER — ONDANSETRON HCL 4 MG/2ML IJ SOLN: 4.0000 mg | Freq: Once | INTRAMUSCULAR | Status: DC | PRN

## 2021-03-12 MED ORDER — FENTANYL CITRATE (PF) 100 MCG/2ML IJ SOLN
INTRAMUSCULAR | Status: AC
  Filled 2021-03-12: qty 2

## 2021-03-12 MED ORDER — LIDOCAINE 2% (20 MG/ML) 5 ML SYRINGE
INTRAMUSCULAR | Status: DC | PRN
  Administered 2021-03-12: 60 mg via INTRAVENOUS

## 2021-03-12 MED ORDER — KETOROLAC TROMETHAMINE 30 MG/ML IJ SOLN
INTRAMUSCULAR | Status: AC
  Filled 2021-03-12: qty 1

## 2021-03-12 MED ORDER — PROPOFOL 10 MG/ML IV BOLUS
INTRAVENOUS | Status: DC | PRN
  Administered 2021-03-12: 150 mg via INTRAVENOUS

## 2021-03-12 MED ORDER — ACETAMINOPHEN 500 MG PO TABS
1000.0000 mg | ORAL_TABLET | Freq: Once | ORAL | Status: AC
  Administered 2021-03-12: 1000 mg via ORAL

## 2021-03-12 MED ORDER — CIPROFLOXACIN IN D5W 400 MG/200ML IV SOLN
INTRAVENOUS | Status: AC
  Filled 2021-03-12: qty 200

## 2021-03-12 MED ORDER — LIDOCAINE 2% (20 MG/ML) 5 ML SYRINGE
INTRAMUSCULAR | Status: AC
  Filled 2021-03-12: qty 5

## 2021-03-12 MED ORDER — CIPROFLOXACIN HCL 500 MG PO TABS: 500.0000 mg | ORAL_TABLET | Freq: Two times a day (BID) | ORAL | 0 refills | Status: AC

## 2021-03-12 MED ORDER — ONDANSETRON HCL 4 MG/2ML IJ SOLN
INTRAMUSCULAR | Status: DC | PRN
  Administered 2021-03-12: 4 mg via INTRAVENOUS

## 2021-03-12 MED ORDER — TRAMADOL HCL 50 MG PO TABS: 50.0000 mg | ORAL_TABLET | Freq: Four times a day (QID) | ORAL | 0 refills | Status: AC | PRN

## 2021-03-12 MED ORDER — DEXAMETHASONE SODIUM PHOSPHATE 10 MG/ML IJ SOLN
INTRAMUSCULAR | Status: DC | PRN
  Administered 2021-03-12: 5 mg via INTRAVENOUS

## 2021-03-12 MED ORDER — DEXAMETHASONE SODIUM PHOSPHATE 10 MG/ML IJ SOLN
INTRAMUSCULAR | Status: AC
  Filled 2021-03-12: qty 1

## 2021-03-12 MED ORDER — ACETAMINOPHEN 500 MG PO TABS
ORAL_TABLET | ORAL | Status: AC
  Filled 2021-03-12: qty 2

## 2021-03-12 MED ORDER — ONDANSETRON HCL 4 MG/2ML IJ SOLN
INTRAMUSCULAR | Status: AC
  Filled 2021-03-12: qty 2

## 2021-03-12 MED ORDER — KETOROLAC TROMETHAMINE 15 MG/ML IJ SOLN
INTRAMUSCULAR | Status: DC | PRN
  Administered 2021-03-12: 15 mg via INTRAVENOUS

## 2021-03-12 MED ORDER — FENTANYL CITRATE (PF) 100 MCG/2ML IJ SOLN: 25.0000 ug | INTRAMUSCULAR | Status: DC | PRN

## 2021-03-12 MED ORDER — SODIUM CHLORIDE 0.9 % IR SOLN
Status: DC | PRN
  Administered 2021-03-12: 3000 mL

## 2021-03-12 MED ORDER — AMISULPRIDE (ANTIEMETIC) 5 MG/2ML IV SOLN: 10.0000 mg | Freq: Once | INTRAVENOUS | Status: DC | PRN

## 2021-03-12 SURGICAL SUPPLY — 28 items
APL SKNCLS STERI-STRIP NONHPOA (GAUZE/BANDAGES/DRESSINGS)
BAG DRAIN URO-CYSTO SKYTR STRL (DRAIN) ×2 IMPLANT
BAG DRN UROCATH (DRAIN) ×1
BASKET STONE 1.7 NGAGE (UROLOGICAL SUPPLIES) IMPLANT
BASKET ZERO TIP NITINOL 2.4FR (BASKET) ×2 IMPLANT
BENZOIN TINCTURE PRP APPL 2/3 (GAUZE/BANDAGES/DRESSINGS) IMPLANT
BSKT STON RTRVL ZERO TP 2.4FR (BASKET) ×1
CATH URET 5FR 28IN OPEN ENDED (CATHETERS) ×2 IMPLANT
CLOTH BEACON ORANGE TIMEOUT ST (SAFETY) ×2 IMPLANT
COVER DOME SNAP 22 D (MISCELLANEOUS) ×2 IMPLANT
FIBER LASER FLEXIVA 365 (UROLOGICAL SUPPLIES) IMPLANT
GLOVE SURG ENC MOIS LTX SZ7.5 (GLOVE) ×2 IMPLANT
GOWN STRL REUS W/TWL XL LVL3 (GOWN DISPOSABLE) ×2 IMPLANT
GUIDEWIRE STR DUAL SENSOR (WIRE) ×2 IMPLANT
GUIDEWIRE ZIPWRE .038 STRAIGHT (WIRE) ×2 IMPLANT
IV NS IRRIG 3000ML ARTHROMATIC (IV SOLUTION) ×2 IMPLANT
KIT TURNOVER CYSTO (KITS) ×2 IMPLANT
MANIFOLD NEPTUNE II (INSTRUMENTS) ×2 IMPLANT
NS IRRIG 500ML POUR BTL (IV SOLUTION) ×2 IMPLANT
PACK CYSTO (CUSTOM PROCEDURE TRAY) ×2 IMPLANT
SHEATH URETERAL 12FRX35CM (MISCELLANEOUS) ×2 IMPLANT
STENT URET 6FRX24 CONTOUR (STENTS) ×2 IMPLANT
STRIP CLOSURE SKIN 1/2X4 (GAUZE/BANDAGES/DRESSINGS) IMPLANT
SYR 10ML LL (SYRINGE) ×2 IMPLANT
TRACTIP FLEXIVA PULS ID 200XHI (Laser) ×2 IMPLANT
TRACTIP FLEXIVA PULSE ID 200 (Laser) ×4
TUBE CONNECTING 12X1/4 (SUCTIONS) ×2 IMPLANT
TUBING UROLOGY SET (TUBING) ×2 IMPLANT

## 2021-03-12 NOTE — Transfer of Care (Signed)
Immediate Anesthesia Transfer of Care Note  Patient: Yvonne Mahoney [facility]  Procedure(s) Performed: Procedure(s) (LRB): CYSTOSCOPY/RETROGRADE/URETEROSCOPY/HOLMIUM LASER/STENT PLACEMENT (Right)  Patient Location: PACU  Anesthesia Type: General  Level of Consciousness: awake, oriented, sedated and patient cooperative  Airway & Oxygen Therapy: Patient Spontanous Breathing and Patient connected to face mask oxygen  Post-op Assessment: Report given to PACU RN and Post -op Vital signs reviewed and stable  Post vital signs: Reviewed and stable  Complications: No apparent anesthesia complications Last Vitals:  Vitals Value Taken Time  BP 141/80 03/12/21 1440  Temp    Pulse 53 03/12/21 1442  Resp 13 03/12/21 1442  SpO2 100 % 03/12/21 1442  Vitals shown include unvalidated device data.  Last Pain:  Vitals:   03/12/21 1059  TempSrc: Oral  PainSc: 0-No pain      Patients Stated Pain Goal: 5 (03/12/21 1059)  Complications: No notable events documented.

## 2021-03-12 NOTE — Anesthesia Procedure Notes (Signed)
Procedure Name: LMA Insertion Date/Time: 03/12/2021 1:07 PM Performed by: Francie Massing, CRNA Pre-anesthesia Checklist: Patient identified, Emergency Drugs available, Suction available and Patient being monitored Patient Re-evaluated:Patient Re-evaluated prior to induction Oxygen Delivery Method: Circle system utilized Preoxygenation: Pre-oxygenation with 100% oxygen Induction Type: IV induction Ventilation: Mask ventilation without difficulty LMA: LMA inserted LMA Size: 4.0 Number of attempts: 1 Airway Equipment and Method: Bite block Placement Confirmation: positive ETCO2 Tube secured with: Tape Dental Injury: Teeth and Oropharynx as per pre-operative assessment

## 2021-03-12 NOTE — Anesthesia Postprocedure Evaluation (Signed)
Anesthesia Post Note  Patient: Yvonne Mahoney [facility]  Procedure(s) Performed: CYSTOSCOPY/RETROGRADE/URETEROSCOPY/HOLMIUM LASER/STENT PLACEMENT (Right: Pelvis)     Patient location during evaluation: PACU Anesthesia Type: General Level of consciousness: awake Pain management: pain level controlled Vital Signs Assessment: post-procedure vital signs reviewed and stable Respiratory status: spontaneous breathing, nonlabored ventilation, respiratory function stable and patient connected to nasal cannula oxygen Cardiovascular status: blood pressure returned to baseline and stable Postop Assessment: no apparent nausea or vomiting Anesthetic complications: no   No notable events documented.  Last Vitals:  Vitals:   03/12/21 1500 03/12/21 1520  BP: 136/71 138/84  Pulse: (!) 57 (!) 57  Resp: 14 16  Temp: (!) 36.3 C (!) 36.3 C  SpO2: 98% 98%    Last Pain:  Vitals:   03/12/21 1620  TempSrc:   PainSc: 0-No pain                 Yvonne Mahoney [name]

## 2021-03-12 NOTE — Op Note (Signed)
Operative Note  Preoperative diagnosis:  1.  Multiple right renal stones the largest which measures approximately 1.5 cm  Postoperative diagnosis: Same  Procedure(s): 1.  Cystoscopy with right ureteroscopy, holmium laser lithotripsy and right JJ stent placement  Surgeon: Rhoderick Moody, MD  Assistants:  None  Anesthesia:  General  Complications:  None  EBL: Less than 5 mL  Specimens: 1.  Right renal stone fragments  Drains/Catheters: 1.  Right 6 French, 24 cm JJ stent without tether  Intraoperative findings:   There was a 1.5 cm stone within the renal pelvis along with multiple 5 mm stones in the mid to lower pole calyces. Solitary right collecting system with no filling defects or dilation involving the right ureter.  There was a large filling defect within the right renal pelvis consistent with the large stone seen on recent cross-sectional imaging.  Indication:  Yvonne Mahoney is a 75 y.o. female with intermittent episodes of right-sided flank pain secondary to a ball valving 1.5 cm right renal pelvis stone that was seen on CT from 03/01/2021.  She was also noted to have multiple smaller right renal stones.  She has been consented for the above procedures, voices understanding and wishes to proceed.  Description of procedure:  After informed consent was obtained, the patient was brought to the operating room and general LMA anesthesia was administered. The patient was then placed in the dorsolithotomy position and prepped and draped in the usual sterile fashion. A timeout was performed. A 23 French rigid cystoscope was then inserted into the urethral meatus and advanced into the bladder under direct vision. A complete bladder survey revealed no intravesical pathology.  A 5 French ureteral catheter was then inserted into the right ureteral orifice and a retrograde pyelogram was obtained, with the findings listed above.  A Glidewire was then used to intubate the lumen  of the ureteral catheter and was advanced up to the right renal pelvis, under fluoroscopic guidance.  The catheter was then removed, leaving the wire in place.  An additional sensor wire was then advanced up the right ureter to the right renal pelvis, or fluoroscopic guidance.  Under fluoroscopic guidance, a 35 cm/14 French ureteral access sheath was then advanced over the sensor wire and into position within the proximal aspects of the right ureter.  A flexible ureteroscope was then advanced through the lumen of the access sheath and into the right renal pelvis where her large stone burden was identified.  A 200 m holmium laser was then used to fracture all identifiable stones within the right renal pelvis to 2 mm or less fragments.  Using a tipless basket, several stone fragments were extracted from the renal pelvis for stone analysis.  The ureteral access sheath and flexible ureteroscope was then removed under direct vision, identifying no evidence of ureteral trauma or stone burden within the lumen of the right ureter.  A 6 French, 24 cm JJ stent was then placed over the Glidewire and into good position within the right collecting system, confirming placement via fluoroscopy.  The patient's bladder was drained.  She tolerated the procedure well and was transferred to the postanesthesia unit in stable condition.  Plan: Follow-up in 1 week for postoperative KUB and likely stent removal

## 2021-03-12 NOTE — Discharge Instructions (Signed)
  Post Anesthesia Home Care Instructions  Activity: Get plenty of rest for the remainder of the day. A responsible individual must stay with you for 24 hours following the procedure.  For the next 24 hours, DO NOT: -Drive a car -Advertising copywriter -Drink alcoholic beverages -Take any medication unless instructed by your physician -Make any legal decisions or sign important papers.  Meals: Start with liquid foods such as gelatin or soup. Progress to regular foods as tolerated. Avoid greasy, spicy, heavy foods. If nausea and/or vomiting occur, drink only clear liquids until the nausea and/or vomiting subsides. Call your physician if vomiting continues.  Special Instructions/Symptoms: Your throat may feel dry or sore from the anesthesia or the breathing tube placed in your throat during surgery. If this causes discomfort, gargle with warm salt water. The discomfort should disappear within 24 hours.  CYSTOSCOPY HOME CARE INSTRUCTIONS  Activity: Rest for the remainder of the day.  Do not drive or operate equipment today.  You may resume normal activities in one to two days as instructed by your physician.   Meals: Drink plenty of liquids and eat light foods such as gelatin or soup this evening.  You may return to a normal meal plan tomorrow.  Return to Work: You may return to work in one to two days or as instructed by your physician.  Special Instructions / Symptoms: Call your physician if any of these symptoms occur:   -persistent or heavy bleeding  -bleeding which continues after first few urination  -large blood clots that are difficult to pass  -urine stream diminishes or stops completely  -fever equal to or higher than 101 degrees Farenheit.  -cloudy urine with a strong, foul odor  -severe pain  Females should always wipe from front to back after elimination.  You may feel some burning pain when you urinate.  This should disappear with time.  Applying moist heat to the lower  abdomen or a hot tub bath may help relieve the pain.   **No acetaminophen or Tylenol until after 5:30pm today if needed. **No ibuprofen, Advil, Aleve, Motrin, ketorolac, meloxicam, naproxen, or other NSAIDS until after 8pm today if needed.

## 2021-03-12 NOTE — Anesthesia Preprocedure Evaluation (Addendum)
Anesthesia Evaluation  Patient identified by MRN, date of birth, ID band Patient awake    Reviewed: Allergy & Precautions, NPO status , Patient's Chart, lab work & pertinent test results  Airway Mallampati: III  TM Distance: >3 FB Neck ROM: Full    Dental  (+) Edentulous Upper, Missing   Pulmonary neg pulmonary ROS,    Pulmonary exam normal breath sounds clear to auscultation       Cardiovascular hypertension, Pt. on medications and Pt. on home beta blockers Normal cardiovascular exam Rhythm:Regular Rate:Normal  ECG: SR, rate 65   Neuro/Psych negative neurological ROS  negative psych ROS   GI/Hepatic Neg liver ROS, GERD  ,  Endo/Other  negative endocrine ROS  Renal/GU Renal disease     Musculoskeletal  (+) Arthritis ,   Abdominal (+) + obese,   Peds  Hematology negative hematology ROS (+)   Anesthesia Other Findings RIGHT URETEROPELVIC JUNCTION STONE  Reproductive/Obstetrics                            Anesthesia Physical Anesthesia Plan  ASA: 2  Anesthesia Plan: General   Post-op Pain Management:    Induction: Intravenous  PONV Risk Score and Plan: 3 and Ondansetron, Dexamethasone and Treatment may vary due to age or medical condition  Airway Management Planned: LMA  Additional Equipment:   Intra-op Plan:   Post-operative Plan: Extubation in OR  Informed Consent: I have reviewed the patients History and Physical, chart, labs and discussed the procedure including the risks, benefits and alternatives for the proposed anesthesia with the patient or authorized representative who has indicated his/her understanding and acceptance.     Dental advisory given  Plan Discussed with: CRNA  Anesthesia Plan Comments:        Anesthesia Quick Evaluation

## 2021-03-12 NOTE — H&P (Signed)
PRE-OP H&P  Office Visit Report     03/03/2021   --------------------------------------------------------------------------------   Yvonne Mahoney  MRN: [id number]  DOB: 04-02-1946, 75 year old Female   PRIMARY CARE:    REFERRING:    PROVIDER:  Rhoderick Moodyhristopher Rubi Tooley, M.D.  LOCATION:  [facility] [phone]     --------------------------------------------------------------------------------   CC: I have pain in the flank.  HPI: Yvonne Mahoney is a 75 year-old female patient who is here for flank pain.  The problem is on the right side. Her pain started about approximately 02/24/2021. The pain is sharp. The pain is intermittent. The pain does radiate.   None< makes the pain better. Nothing causes the pain to become worse.   -CT stone study from 03/01/2021 revealed a 1.5 cm right renal pelvis/UPJ stone along with multiple nonobstructing right renal stones.  -Labs from 03/01/2021 revealed a serum creatinine of 0.74, white blood cell count of 7.4 and UA positive for blood and leukocytes with no other overt signs of UTI.  -No prior hx of kidney stones  -Today, she states that her pain has improved since the weekend and she is not requiring pain or nausea medication. She denies fever/chills, dysuria or hematuria.     ALLERGIES: Methocarbamol Naproxen TABS Oxycodone-Acetaminophen CAPS Penicillin    MEDICATIONS: Aspirin 325 mg tablet  Metoprolol Succinate 100 mg tablet, extended release 24 hr  Tamsulosin Hcl 0.4 mg capsule  Acetaminophen  Amlodipine Besylate 10 mg tablet  Diclofenac Sodium 1 % gel  Ondansetron Hcl 4 mg tablet  Prednisone 10 mg tablet     GU PSH: None   NON-GU PSH: Anesth, Lower Leg Vein Surg - 2016 Cataract surgery - 2015 Hysterectomy - 1987 Knee Arthroscopy/surgery - 1987 Remove Gallbladder - 1988     GU PMH: None     PMH Notes: -kidney stone  -DVT following sclerotherapy of varicose vein   NON-GU PMH:  Arthritis Glaucoma Hypercholesterolemia Hypertension    FAMILY HISTORY: 1 son - Other   SOCIAL HISTORY: No Social History    REVIEW OF SYSTEMS:    GU Review Female:   Patient reports frequent urination and get up at night to urinate. Patient denies hard to postpone urination, burning /pain with urination, leakage of urine, stream starts and stops, trouble starting your stream, have to strain to urinate, and being pregnant.  Gastrointestinal (Upper):   Patient reports indigestion/ heartburn. Patient denies nausea and vomiting.  Gastrointestinal (Lower):   Patient denies diarrhea and constipation.  Constitutional:   Patient reports night sweats and fatigue. Patient denies fever and weight loss.  Skin:   Patient denies itching and skin rash/ lesion.  Eyes:   Patient denies blurred vision and double vision.  Ears/ Nose/ Throat:   Patient reports sinus problems. Patient denies sore throat.  Hematologic/Lymphatic:   Patient reports swollen glands. Patient denies easy bruising.  Cardiovascular:   Patient reports leg swelling and chest pains.   Respiratory:   Patient denies cough and shortness of breath.  Endocrine:   Patient reports excessive thirst.   Musculoskeletal:   Patient reports back pain and joint pain.   Neurological:   Patient denies headaches and dizziness.  Psychologic:   Patient denies depression and anxiety.   VITAL SIGNS:      03/03/2021 11:40 AM  Weight 205 lb / 92.99 kg  Height 64 in / 162.56 cm  BP 109/70 mmHg  Heart Rate 81 /min  Temperature 97.8 F / 36.5 C  BMI 35.2 kg/m  MULTI-SYSTEM PHYSICAL EXAMINATION:    Constitutional: Well-nourished. No physical deformities. Normally developed. Good grooming.  Neurologic / Psychiatric: Oriented to time, oriented to place, oriented to person. No depression, no anxiety, no agitation.  Musculoskeletal: Normal gait and station of head and neck.     Complexity of Data:  Records Review:   Previous Patient Records  Urine  Test Review:   Urinalysis  X-Ray Review: C.T. Abdomen/Pelvis: Reviewed Films. Reviewed Report. Discussed With Patient.    Notes:                     CLINICAL DATA: Increased pain on exertion and while laying down. Pt  c/o of bloating and gas.     EXAM:  CT ABDOMEN AND PELVIS WITH CONTRAST     CT LUMBAR SPINE WITHOUT CONTRAST     TECHNIQUE:  Multidetector CT imaging of the abdomen and pelvis was performed  using the standard protocol following bolus administration of  intravenous contrast.     Multidetector CT imaging of the lumbar spine was performed without  intravenous contrast administration. Multiplanar CT image  reconstructions were also generated.     OMNIPAQUE IOHEXOL 350 MG/ML SOLN     COMPARISON: None.     FINDINGS:  ABDOMEN/PELVIS     Lower chest: No acute abnormality.     Hepatobiliary: Diffusely hypodense hepatic parenchyma compared to  the spleen. No focal liver abnormality. Status post cholecystectomy.  No biliary dilatation.     Pancreas: No focal lesion. Normal pancreatic contour. No surrounding  inflammatory changes. No main pancreatic ductal dilatation.     Spleen: Normal in size without focal abnormality.     Adrenals/Urinary Tract:     No adrenal nodule bilaterally.     Bilateral kidneys enhance symmetrically. Partially duplicated left  collecting system.     Right nephrolithiasis with largest stone measuring up to 1.5 cm  centered within the right renal pelvis. Associated mild haziness and  thickening/enhancement of the urothelium. Other calcified stones are  noted within the kidney but are smaller in size. No frank  hydronephrosis. No hydroureter.     There is a 1.4 cm lesion within the right kidney with a density of  50 Hounsfield units (3:43). Another lesion measuring 1 cm  demonstrates a density of 4.4 cm (3:43). Adjacent 1.6 cm fluid  density lesion likely represents a simple renal cyst. Subcentimeter  hypodensities are too small  to characterize.     The urinary bladder is unremarkable.     Stomach/Bowel: Stomach is within normal limits. No evidence of bowel  wall thickening or dilatation. Appendix appears normal.     Vascular/Lymphatic: No abdominal aorta or iliac aneurysm. Mild  atherosclerotic plaque of the aorta and its branches. No abdominal,  pelvic, or inguinal lymphadenopathy.     Reproductive: Status post hysterectomy. No adnexal masses.     Other: No intraperitoneal free fluid. No intraperitoneal free gas.  No organized fluid collection.     Musculoskeletal:     No abdominal wall hernia or abnormality.     No suspicious lytic or blastic osseous lesions. No acute displaced  fracture.     LUMBAR SPINE     Segmentation: 5 lumbar type vertebrae.     Alignment: Normal.     Vertebrae: Multilevel degenerative changes of the spine with  intervertebral disc space vacuum phenomenon and posterior disc  osteophyte complex at the L5-S1 level osseous neural foraminal  stenosis of the right L5-S1 level.  Paraspinal and other soft tissues: Negative.     IMPRESSION:  1. Early/partially obstructive 1.5 cm right nephrolithiasis centered  within the renal pelvis with likely superimposed infection.  Correlate with urinalysis. Otherwise nonobstructive subcentimeter  right nephrolithiasis also noted.  2. Couple of indeterminate 1 cm and 1.4 cm right renal lesions.  Recommend MRI renal protocol further evaluation. When the patient is  clinically stable and able to follow directions and hold their  breath (preferably as an outpatient) further evaluation with  dedicated abdominal MRI should be considered.  3. 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  4. Hepatic steatosis.  5. 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        Electronically Signed  By: Iven Finn M.D.  On: 03/01/2021 17:49   PROCEDURES: None   ASSESSMENT:      ICD-10  Details  1 GU:   Flank Pain - R10.84 Right, Undiagnosed New Problem  2   Renal and ureteral calculus - N20.2 Right, Undiagnosed New Problem   PLAN:           Orders Labs Urine Culture          Schedule Return Visit/Planned Activity: Next Available Appointment - Schedule Surgery          Document Letter(s):  Created for Patient: Clinical Summary         Notes:   The risks, benefits and alternatives of cystoscopy with RIGHT ureteroscopy, laser lithotripsy and ureteral stent placement was discussed the patient. Risks included, but are not limited to: bleeding, urinary tract infection, ureteral injury/avulsion, ureteral stricture formation, retained stone fragments, the possibility that multiple staged surgeries may be required to treat the stone(s), MI, stroke, PE and the inherent risks of general anesthesia. The patient voices understanding and wishes to proceed.

## 2021-03-13 ENCOUNTER — Encounter (HOSPITAL_BASED_OUTPATIENT_CLINIC_OR_DEPARTMENT_OTHER): Payer: Self-pay | Admitting: Urology

## 2021-11-27 ENCOUNTER — Encounter (HOSPITAL_COMMUNITY): Payer: Self-pay | Admitting: Emergency Medicine

## 2021-11-27 ENCOUNTER — Emergency Department (HOSPITAL_COMMUNITY): Payer: Medicare Other

## 2021-11-27 ENCOUNTER — Other Ambulatory Visit: Payer: Self-pay

## 2021-11-27 ENCOUNTER — Emergency Department (HOSPITAL_COMMUNITY)
Admission: EM | Admit: 2021-11-27 | Discharge: 2021-11-27 | Disposition: A | Discharge status: Home / Self Care | Payer: Medicare Other | Attending: Emergency Medicine | Admitting: Emergency Medicine

## 2021-11-27 DIAGNOSIS — M25551 Pain in right hip: Secondary | ICD-10-CM | POA: Diagnosis present

## 2021-11-27 DIAGNOSIS — Z7982 Long term (current) use of aspirin: Secondary | ICD-10-CM | POA: Insufficient documentation

## 2021-11-27 LAB — URINALYSIS, ROUTINE W REFLEX MICROSCOPIC
Bilirubin Urine: NEGATIVE
Glucose, UA: NEGATIVE mg/dL
Hgb urine dipstick: NEGATIVE
Ketones, ur: NEGATIVE mg/dL
Nitrite: NEGATIVE
Protein, ur: NEGATIVE mg/dL
Specific Gravity, Urine: 1.015 (ref 1.005–1.030)
pH: 7 (ref 5.0–8.0)

## 2021-11-27 MED ORDER — MELOXICAM 7.5 MG PO TABS: 7.5000 mg | ORAL_TABLET | Freq: Every day | ORAL | 0 refills | Status: AC

## 2021-11-27 NOTE — ED Triage Notes (Signed)
Pt with c/o pain to her R groin that radiates to R ankle. States pain has been on-going for "days". Has tried OTC pain medication without relief.

## 2021-11-27 NOTE — ED Provider Notes (Signed)
[facility] EMERGENCY DEPARTMENT Provider Note   CSN: [id number] Arrival date & time: 11/27/21  0123     History  Chief Complaint  Patient presents with   Leg Pain    Yvonne Mahoney is a 76 y.o. female.  Patient presents to the emergency department for evaluation of right hip pain.  Symptoms have been ongoing for "weeks".  Patient reports that it hurts more severely if she is sitting, such as when she drives.  It also worsens at night when she lies down.  She cannot lie on her right side because of pain.  Pain sometimes radiates down her leg.       Home Medications Prior to Admission medications   Medication Sig Start Date End Date Taking? Authorizing Provider  meloxicam (MOBIC) 7.5 MG tablet Take 1 tablet (7.5 mg total) by mouth daily. 11/27/21  Yes Shuayb Schepers, Canary Brim, MD  acetaminophen (TYLENOL) 650 MG CR tablet Take 650 mg by mouth every 8 (eight) hours as needed for pain.    [provider]  amLODipine (NORVASC) 10 MG tablet Take 1 tablet (10 mg total) by mouth daily. 09/17/14   Carmelina Dane, MD  aspirin 325 MG tablet Take 325 mg by mouth daily as needed for moderate pain. Rarely takes ASA.    [provider]  metoprolol succinate (TOPROL-XL) 100 MG 24 hr tablet Take 100 mg by mouth daily. 07/28/19   [provider]  ondansetron (ZOFRAN ODT) 4 MG disintegrating tablet Take 1 tablet (4 mg total) by mouth every 8 (eight) hours as needed for nausea or vomiting. 03/01/21   Jacalyn Lefevre, MD  predniSONE (STERAPRED UNI-PAK 21 TAB) 10 MG (21) TBPK tablet Take by mouth daily. Take 6 tabs by mouth daily  for 2 days, then 5 tabs for 2 days, then 4 tabs for 2 days, then 3 tabs for 2 days, 2 tabs for 2 days, then 1 tab by mouth daily for 2 days Patient taking differently: Take by mouth daily. Take 6 tabs by mouth daily  for 2 days, then 5 tabs for 2 days, then 4 tabs for 2 days, then 3 tabs for 2 days, 2 tabs for 2 days, then 1 tab by mouth daily  for 2 days  Patient stated that she is not taking as directed. She states that she has only taken 1 tablet for 2 days. 03/01/21   Jacalyn Lefevre, MD  tamsulosin (FLOMAX) 0.4 MG CAPS capsule Take 1 capsule (0.4 mg total) by mouth daily. Patient taking differently: Take 0.4 mg by mouth daily. Patient has never taken due to reading the potential side effects per pt on 03/06/2021. 03/01/21   Jacalyn Lefevre, MD      Allergies    Methocarbamol, Naproxen, Oxycodone, and Penicillins    Review of Systems   Review of Systems  Physical Exam Updated Vital Signs BP 127/62 (BP Location: Right Arm)   Pulse 75   Temp 97.7 F (36.5 C)   Resp 18   Ht 5\' 4"  (1.626 m)   Wt 90.7 kg   SpO2 96%   BMI 34.33 kg/m  Physical Exam Vitals and nursing note reviewed.  Constitutional:      General: She is not in acute distress.    Appearance: She is well-developed.  HENT:     Head: Normocephalic and atraumatic.     Mouth/Throat:     Mouth: Mucous membranes are moist.  Eyes:     General: Vision grossly intact. Gaze aligned appropriately.  Extraocular Movements: Extraocular movements intact.     Conjunctiva/sclera: Conjunctivae normal.  Cardiovascular:     Rate and Rhythm: Normal rate and regular rhythm.     Pulses: Normal pulses.          Dorsalis pedis pulses are 2+ on the right side and 2+ on the left side.     Heart sounds: Normal heart sounds, S1 normal and S2 normal. No murmur heard.    No friction rub. No gallop.  Pulmonary:     Effort: Pulmonary effort is normal. No respiratory distress.     Breath sounds: Normal breath sounds.  Abdominal:     General: Bowel sounds are normal.     Palpations: Abdomen is soft.     Tenderness: There is no abdominal tenderness. There is no guarding or rebound.     Hernia: No hernia is present.  Musculoskeletal:        General: No swelling.     Cervical back: Full passive range of motion without pain, normal range of motion and neck supple. No spinous  process tenderness or muscular tenderness. Normal range of motion.     Lumbar back: Negative right straight leg raise test and negative left straight leg raise test.     Right hip: No deformity. Normal range of motion.     Left hip: No deformity. Normal range of motion.     Right lower leg: No edema.     Left lower leg: No edema.     Comments: Patient did have right groin area pain with straight leg raise but no radicular pain  Skin:    General: Skin is warm and dry.     Capillary Refill: Capillary refill takes less than 2 seconds.     Findings: No ecchymosis, erythema, rash or wound.  Neurological:     General: No focal deficit present.     Mental Status: She is alert and oriented to person, place, and time.     GCS: GCS eye subscore is 4. GCS verbal subscore is 5. GCS motor subscore is 6.     Cranial Nerves: Cranial nerves 2-12 are intact.     Sensory: Sensation is intact.     Motor: Motor function is intact.     Coordination: Coordination is intact.  Psychiatric:        Attention and Perception: Attention normal.        Mood and Affect: Mood normal.        Speech: Speech normal.        Behavior: Behavior normal.     ED Results / Procedures / Treatments   Labs (all labs ordered are listed, but only abnormal results are displayed) Labs Reviewed  URINALYSIS, ROUTINE W REFLEX MICROSCOPIC - Abnormal; Notable for the following components:      Result Value   Leukocytes,Ua SMALL (*)    Bacteria, UA RARE (*)    All other components within normal limits    EKG None  Radiology DG Hip Unilat W or Wo Pelvis 2-3 Views Right  Result Date: 11/27/2021 CLINICAL DATA:  Low back and hip pain. EXAM: DG HIP (WITH OR WITHOUT PELVIS) 2-3V RIGHT COMPARISON:  None Available. FINDINGS: There is no evidence of hip fracture or dislocation. Moderate subchondral sclerosis, joint space narrowing and osteophyte formation is present at the hips bilaterally. Degenerative changes are present in the lower  lumbar spine and sacroiliac joints. Surgical clips are noted in the pelvis. 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 Electronically Signed   By: Thornell Sartorius M.D.   On: 11/27/2021 03:33   DG Lumbar Spine Complete  Result Date: 11/27/2021 CLINICAL DATA:  Low back pain. EXAM: LUMBAR SPINE - COMPLETE 4+ VIEW COMPARISON:  Lumbar radiograph dated 09/19/2014. FINDINGS: Five lumbar type vertebra. There is no acute fracture or subluxation of the lumbar spine. Multilevel degenerative changes with osteophyte most prominent at L5-S1. Multilevel facet arthropathy. The visualized posterior elements are intact. Surgical clips in the left hemipelvis as well as right upper quadrant cholecystectomy clips. The soft tissues are unremarkable IMPRESSION: 1. No acute/traumatic lumbar spine pathology. 2. Osteopenia with degenerative changes. Electronically Signed   By: Elgie Collard M.D.   On: 11/27/2021 03:32    Procedures Procedures    Medications Ordered in ED Medications - No data to display  ED Course/ Medical Decision Making/ A&P                           Medical Decision Making Amount and/or Complexity of Data Reviewed Labs: ordered. Radiology: ordered.   Patient presents to the emergency department for evaluation of right hip pain.  Patient denies any direct injury.  Patient is experiencing pain in the right groin when she sits down.  Pain does radiate down the leg at times.  When she is sitting she also has right buttock pain.  There is some mild pain in the lower back as well.  Lumbar radiculopathy versus piriformis syndrome, bursitis and osteoarthritis of the right hip are considered.  She does have full range of motion at the hip without tenderness or warmth, doubt septic joint.  X-ray shows arthritis of the hip but both hips are symmetric.  No fracture or pathology noted.  Lumbar spine unremarkable.  We will treat for osteoarthritis and likely soft  tissue inflammation.  Patient does not have a primary care doctor, will refer to orthopedics for follow-up.  Return to the ER for worsening pain, decreased motion, fever.        Final Clinical Impression(s) / ED Diagnoses Final diagnoses:  Pain of right hip    Rx / DC Orders ED Discharge Orders          Ordered    Ambulatory referral to Orthopedic Surgery        11/27/21 0513    meloxicam (MOBIC) 7.5 MG tablet  Daily        11/27/21 0513              Gilda Crease, MD 11/27/21 [phone]

## 2021-12-11 ENCOUNTER — Encounter: Payer: Self-pay | Admitting: Orthopedic Surgery

## 2022-01-16 ENCOUNTER — Encounter (HOSPITAL_COMMUNITY): Payer: Self-pay | Admitting: *Deleted

## 2022-01-16 ENCOUNTER — Other Ambulatory Visit: Payer: Self-pay

## 2022-01-16 ENCOUNTER — Emergency Department (HOSPITAL_COMMUNITY): Payer: Medicare Other

## 2022-01-16 ENCOUNTER — Emergency Department (HOSPITAL_COMMUNITY)
Admission: EM | Admit: 2022-01-16 | Discharge: 2022-01-16 | Disposition: A | Discharge status: Home / Self Care | Payer: Medicare Other | Attending: Emergency Medicine | Admitting: Emergency Medicine

## 2022-01-16 DIAGNOSIS — Z7982 Long term (current) use of aspirin: Secondary | ICD-10-CM | POA: Diagnosis not present

## 2022-01-16 DIAGNOSIS — Z79899 Other long term (current) drug therapy: Secondary | ICD-10-CM | POA: Diagnosis not present

## 2022-01-16 DIAGNOSIS — I10 Essential (primary) hypertension: Secondary | ICD-10-CM | POA: Insufficient documentation

## 2022-01-16 DIAGNOSIS — N200 Calculus of kidney: Secondary | ICD-10-CM | POA: Insufficient documentation

## 2022-01-16 DIAGNOSIS — R1031 Right lower quadrant pain: Secondary | ICD-10-CM | POA: Diagnosis present

## 2022-01-16 LAB — COMPREHENSIVE METABOLIC PANEL
ALT: 23 U/L (ref 0–44)
AST: 21 U/L (ref 15–41)
Albumin: 3.8 g/dL (ref 3.5–5.0)
Alkaline Phosphatase: 76 U/L (ref 38–126)
Anion gap: 6 (ref 5–15)
BUN: 12 mg/dL (ref 8–23)
CO2: 27 mmol/L (ref 22–32)
Calcium: 10.6 mg/dL — ABNORMAL HIGH (ref 8.9–10.3)
Chloride: 108 mmol/L (ref 98–111)
Creatinine, Ser: 0.83 mg/dL (ref 0.44–1.00)
GFR, Estimated: >60 mL/min (ref >60)
Glucose, Bld: 99 mg/dL (ref 70–99)
Potassium: 4.1 mmol/L (ref 3.5–5.1)
Sodium: 141 mmol/L (ref 135–145)
Total Bilirubin: 0.5 mg/dL (ref 0.3–1.2)
Total Protein: 7.5 g/dL (ref 6.5–8.1)

## 2022-01-16 LAB — URINALYSIS, ROUTINE W REFLEX MICROSCOPIC
Bilirubin Urine: NEGATIVE
Glucose, UA: NEGATIVE mg/dL
Hgb urine dipstick: NEGATIVE
Ketones, ur: NEGATIVE mg/dL
Leukocytes,Ua: NEGATIVE
Nitrite: NEGATIVE
Protein, ur: NEGATIVE mg/dL
Specific Gravity, Urine: 1.016 (ref 1.005–1.030)
pH: 5 (ref 5.0–8.0)

## 2022-01-16 LAB — CBC
HCT: 39.6 % (ref 36.0–46.0)
Hemoglobin: 12.6 g/dL (ref 12.0–15.0)
MCH: 26.7 pg (ref 26.0–34.0)
MCHC: 31.8 g/dL (ref 30.0–36.0)
MCV: 83.9 fL (ref 80.0–100.0)
Platelets: 223 10*3/uL (ref 150–400)
RBC: 4.72 MIL/uL (ref 3.87–5.11)
RDW: 14.6 % (ref 11.5–15.5)
WBC: 7.1 10*3/uL (ref 4.0–10.5)
nRBC: 0 % (ref 0.0–0.2)

## 2022-01-16 LAB — LIPASE, BLOOD: Lipase: 26 U/L (ref 11–51)

## 2022-01-16 MED ORDER — TRAMADOL HCL 50 MG PO TABS: 50.0000 mg | ORAL_TABLET | Freq: Four times a day (QID) | ORAL | 0 refills | Status: AC | PRN

## 2022-01-16 MED ORDER — IOHEXOL 300 MG/ML  SOLN
100.0000 mL | Freq: Once | INTRAMUSCULAR | Status: AC | PRN
  Administered 2022-01-16: 100 mL via INTRAVENOUS

## 2022-01-16 NOTE — ED Triage Notes (Signed)
Pt with RLQ pain,denies any N/V/D.

## 2022-01-16 NOTE — ED Provider Notes (Signed)
[facility] EMERGENCY DEPARTMENT Provider Note   CSN: [id number] Arrival date & time: 01/16/22  1412     History {Add pertinent medical, surgical, social history, OB history to HPI:1} Chief Complaint  Patient presents with   Abdominal Pain    Yvonne Mahoney is a 76 y.o. female.  The history is provided by the patient.       Home Medications Prior to Admission medications   Medication Sig Start Date End Date Taking? Authorizing Provider  acetaminophen (TYLENOL) 650 MG CR tablet Take 650 mg by mouth every 8 (eight) hours as needed for pain.    [provider]  amLODipine (NORVASC) 10 MG tablet Take 1 tablet (10 mg total) by mouth daily. 09/17/14   Carmelina Dane, MD  aspirin 325 MG tablet Take 325 mg by mouth daily as needed for moderate pain. Rarely takes ASA.    [provider]  meloxicam (MOBIC) 7.5 MG tablet Take 1 tablet (7.5 mg total) by mouth daily. 11/27/21   Gilda Crease, MD  metoprolol succinate (TOPROL-XL) 100 MG 24 hr tablet Take 100 mg by mouth daily. 07/28/19   [provider]  ondansetron (ZOFRAN ODT) 4 MG disintegrating tablet Take 1 tablet (4 mg total) by mouth every 8 (eight) hours as needed for nausea or vomiting. 03/01/21   Jacalyn Lefevre, MD  predniSONE (STERAPRED UNI-PAK 21 TAB) 10 MG (21) TBPK tablet Take by mouth daily. Take 6 tabs by mouth daily  for 2 days, then 5 tabs for 2 days, then 4 tabs for 2 days, then 3 tabs for 2 days, 2 tabs for 2 days, then 1 tab by mouth daily for 2 days Patient taking differently: Take by mouth daily. Take 6 tabs by mouth daily  for 2 days, then 5 tabs for 2 days, then 4 tabs for 2 days, then 3 tabs for 2 days, 2 tabs for 2 days, then 1 tab by mouth daily for 2 days  Patient stated that she is not taking as directed. She states that she has only taken 1 tablet for 2 days. 03/01/21   Jacalyn Lefevre, MD  tamsulosin (FLOMAX) 0.4 MG CAPS capsule Take 1 capsule (0.4 mg total) by mouth  daily. Patient taking differently: Take 0.4 mg by mouth daily. Patient has never taken due to reading the potential side effects per pt on 03/06/2021. 03/01/21   Jacalyn Lefevre, MD      Allergies    Methocarbamol, Naproxen, Oxycodone, and Penicillins    Review of Systems   Review of Systems  Physical Exam Updated Vital Signs BP (!) 153/77   Pulse 77   Temp 98 F (36.7 C)   Resp 16   Ht 5\' 4"  (1.626 m)   Wt 92.1 kg   SpO2 96%   BMI 34.84 kg/m  Physical Exam  ED Results / Procedures / Treatments   Labs (all labs ordered are listed, but only abnormal results are displayed) Labs Reviewed  COMPREHENSIVE METABOLIC PANEL - Abnormal; Notable for the following components:      Result Value   Calcium 10.6 (*)    All other components within normal limits  URINALYSIS, ROUTINE W REFLEX MICROSCOPIC - Abnormal; Notable for the following components:   APPearance HAZY (*)    All other components within normal limits  LIPASE, BLOOD  CBC    EKG None  Radiology CT ABDOMEN PELVIS W CONTRAST  Result Date: 01/16/2022 CLINICAL DATA:  Right lower quadrant pain EXAM: CT ABDOMEN AND  PELVIS WITH CONTRAST TECHNIQUE: Multidetector CT imaging of the abdomen and pelvis was performed using the standard protocol following bolus administration of intravenous contrast. RADIATION DOSE REDUCTION: This exam was performed according to the departmental dose-optimization program which includes automated exposure control, adjustment of the mA and/or kV according to patient size and/or use of iterative reconstruction technique. CONTRAST:  OMNIPAQUE IOHEXOL 300 MG/ML  SOLN COMPARISON:  CT 03/01/2021, ultrasound 05/22/2021 FINDINGS: Lower chest: Lung bases are clear. Hepatobiliary: No focal liver abnormality is seen. Status post cholecystectomy. No biliary dilatation. Pancreas: Unremarkable. No pancreatic ductal dilatation or surrounding inflammatory changes. Spleen: Normal in size without focal abnormality.  Adrenals/Urinary Tract: Adrenal glands are normal. 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 Multiple right-sided kidney stones measuring up to 10 mm. No hydronephrosis. Indeterminate exophytic renal lesions measuring 18 mm posterior mid cortex and 12 mm anterior mid cortex. Urinary bladder is unremarkable except for possible small cystocele. Small calcification within the dependent posterior bladder Stomach/Bowel: The stomach is nonenlarged. No dilated small bowel. No acute bowel wall thickening. Negative appendix. Vascular/Lymphatic: Nonaneurysmal aorta.  No suspicious lymph nodes. Reproductive: Status post hysterectomy. No adnexal masses. Other: Negative for pelvic effusion or free air. Musculoskeletal: No acute osseous abnormality. IMPRESSION: 1. No CT evidence for acute intra-abdominal or pelvic abnormality. Negative for acute appendicitis. Small posterior bladder calcification could reflect recently passed kidney stone. There is no significant hydronephrosis within either kidney. 2. Multiple right kidney stones. At least 2 indeterminate right renal lesions for which nonemergent MRI was previously recommended. Electronically Signed   By: Jasmine Pang M.D.   On: 01/16/2022 19:08    Procedures Procedures  {Document cardiac monitor, telemetry assessment procedure when appropriate:1}  Medications Ordered in ED Medications  iohexol (OMNIPAQUE) 300 MG/ML solution 100 mL (100 mLs Intravenous Contrast Given 01/16/22 1843)    ED Course/ Medical Decision Making/ A&P                           Medical Decision Making Amount and/or Complexity of Data Reviewed Labs: ordered. Radiology: ordered.  Risk Prescription drug management.   ***  {Document critical care time when appropriate:1} {Document review of labs and clinical decision tools ie heart score, Chads2Vasc2 etc:1}  {Document your independent review of radiology images, and any outside records:1} {Document your  discussion with family members, caretakers, and with consultants:1} {Document social determinants of health affecting pt's care:1} {Document your decision making why or why not admission, treatments were needed:1} Final Clinical Impression(s) / ED Diagnoses Final diagnoses:  None    Rx / DC Orders ED Discharge Orders     None

## 2022-01-16 NOTE — Discharge Instructions (Signed)
Take medication prescribed if needed for return of your pain.  It appears that you may have passed the stone which would explain your symptoms as it appears it is currently in your bladder and should completely pass with urination.Strain your urine so you will know when it has resolved completely.   As discussed, there are 2 small lesions in your right kidney which will require further evaluation.  An MRI of your kidney is being recommended.  Please discuss this when you follow-up with the urologist.

## 2022-01-16 NOTE — ED Notes (Signed)
Patient given a urinary strainer and urine cup for d/c.

## 2022-01-26 ENCOUNTER — Encounter: Payer: Self-pay | Admitting: Urology

## 2022-01-26 ENCOUNTER — Ambulatory Visit (INDEPENDENT_AMBULATORY_CARE_PROVIDER_SITE_OTHER): Payer: Medicare Other | Admitting: Urology

## 2022-01-26 VITALS — BP 129/82 | HR 75 | Ht 64.0 in | Wt 205.0 lb

## 2022-01-26 DIAGNOSIS — N200 Calculus of kidney: Secondary | ICD-10-CM

## 2022-01-26 DIAGNOSIS — N2889 Other specified disorders of kidney and ureter: Secondary | ICD-10-CM

## 2022-01-26 LAB — URINALYSIS, ROUTINE W REFLEX MICROSCOPIC
Bilirubin, UA: NEGATIVE
Glucose, UA: NEGATIVE
Ketones, UA: NEGATIVE
Leukocytes,UA: NEGATIVE
Nitrite, UA: NEGATIVE
RBC, UA: NEGATIVE
Specific Gravity, UA: 1.025 (ref 1.005–1.030)
Urobilinogen, Ur: 0.2 mg/dL (ref 0.2–1.0)
pH, UA: 5 (ref 5.0–7.5)

## 2022-01-26 NOTE — Progress Notes (Signed)
Assessment: 1. Nephrolithiasis   2. Renal mass     Plan: I reviewed records from her recent ER visit as well as records from her prior urologic procedure by Dr. Lovena Neighbours. I personally reviewed the CT study from 01/16/2022 with results as noted below. Her right lower quadrant/inguinal pain would be unusual for a stone; however, given the findings on CT, she may have passed a stone prior to the study.  She is currently asymptomatic. Recommend further evaluation of the indeterminate right renal masses with a MRI with and without contrast. 24-hour urine for metabolic evaluation for stone disease We will contact her with results of the MRI and 24-hour urine. Stone prevention discussed and information provided.  Chief Complaint:  Chief Complaint  Patient presents with   Nephrolithiasis    History of Present Illness:  Yvonne Mahoney is a 76 y.o. female who is seen in consultation from Maurice Small, MD for evaluation of nephrolithiasis and right renal lesions.  She was recently evaluated in the emergency room with CT imaging for right lower quadrant pain.  She reported a 2-day history of pain in the right lower quadrant near the inguinal crease, increased with movement and with certain positions.  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  Prior CT from 10/22 showed right nephrolithiasis with a stone measuring 1.5 cm in the right renal pelvis, 1.4 cm lesion within the right kidney and another measuring 1 cm.  She previously underwent cystoscopy with right ureteroscopy and laser lithotripsy for a 1.5 cm stone in the right renal pelvis in November 2022. Stone analysis: Calcium oxalate monohydrate 70%, calcium oxalate  dihydrate 15%, calcium apatite 15%  Past Medical History:  Past Medical History:  Diagnosis Date   Allergy    Arthritis    Cataract    GERD (gastroesophageal reflux disease)    Hypertension    Kidney stone 03/01/2021   Prediabetes    pt denies being prediabetic 03/06/2021   Spinal stenosis    lumbar region   Wears dentures    upper only   Wears glasses     Past Surgical History:  Past Surgical History:  Procedure Laterality Date   ABDOMINAL HYSTERECTOMY     BUNIONECTOMY  05/11/1984   both   CHOLECYSTECTOMY     CYSTOSCOPY/URETEROSCOPY/HOLMIUM LASER/STENT PLACEMENT Right 03/12/2021   Procedure: CYSTOSCOPY/RETROGRADE/URETEROSCOPY/HOLMIUM LASER/STENT PLACEMENT;  Surgeon: Ceasar Mons, MD;  Location: [facility];  Service: Urology;  Laterality: Right;   SCHLEROTHERAPY     multiple treatments to legs    Allergies:  Allergies  Allergen Reactions   Methocarbamol Nausea And Vomiting   Naproxen Nausea And Vomiting   Oxycodone Nausea And Vomiting   Penicillins Itching and Swelling    Family History:  Family History  Problem Relation Age of Onset   Dementia Mother    Hypertension Mother    Diabetes Mother    Cancer Father    Cancer Sister    Hyperlipidemia Sister    Hypertension Sister    Thyroid disease Maternal Aunt    Breast cancer Maternal Aunt    Diabetes Paternal Aunt     Social History:  Social History   Tobacco Use   Smoking status: Never   Smokeless tobacco: Never  Vaping  Use   Vaping Use: Never used  Substance Use Topics   Alcohol use: No   Drug use: No    Review of symptoms:  Constitutional:  Negative for unexplained weight loss, night sweats, fever, chills ENT:  Negative for nose bleeds, sinus pain, painful swallowing CV:  Negative for chest pain, shortness of breath, exercise intolerance, palpitations, loss of consciousness Resp:  Negative for cough, wheezing, shortness of breath GI:  Negative for nausea, vomiting,  diarrhea, bloody stools GU:  Positives noted in HPI; otherwise negative for gross hematuria, dysuria, urinary incontinence Neuro:  Negative for seizures, poor balance, limb weakness, slurred speech Psych:  Negative for lack of energy, depression, anxiety Endocrine:  Negative for polydipsia, polyuria, symptoms of hypoglycemia (dizziness, hunger, sweating) Hematologic:  Negative for anemia, purpura, petechia, prolonged or excessive bleeding, use of anticoagulants  Allergic:  Negative for difficulty breathing or choking as a result of exposure to anything; no shellfish allergy; no allergic response (rash/itch) to materials, foods  Physical exam: BP 129/82   Pulse 75   Ht 5\' 4"  (1.626 m)   Wt 205 lb (93 kg)   BMI 35.19 kg/m  GENERAL APPEARANCE:  Well appearing, well developed, well nourished, NAD HEENT: Atraumatic, Normocephalic, oropharynx clear. NECK: Supple without lymphadenopathy or thyromegaly. LUNGS: Clear to auscultation bilaterally. HEART: Regular Rate and Rhythm without murmurs, gallops, or rubs. ABDOMEN: Soft, non-tender, No Masses. EXTREMITIES: Moves all extremities well.  Without clubbing, cyanosis, or edema. NEUROLOGIC:  Alert and oriented x 3, normal gait, CN II-XII grossly intact.  MENTAL STATUS:  Appropriate. BACK:  Non-tender to palpation.  No CVAT SKIN:  Warm, dry and intact.    Results: Results for orders placed or performed in visit on 01/26/22 (from the past 24 hour(s))  Urinalysis, Routine w reflex microscopic     Status: Abnormal   Collection Time: 01/26/22  1:59 PM  Result Value Ref Range   Specific Gravity, UA 1.025 1.005 - 1.030   pH, UA 5.0 5.0 - 7.5   Color, UA Yellow Yellow   Appearance Ur Clear Clear   Leukocytes,UA Negative Negative   Protein,UA Trace (A) Negative/Trace   Glucose, UA Negative Negative   Ketones, UA Negative Negative   RBC, UA Negative Negative   Bilirubin, UA Negative Negative   Urobilinogen, Ur 0.2 0.2 - 1.0 mg/dL   Nitrite, UA  Negative Negative   Microscopic Examination Comment    Narrative   Performed at:  [address] - [facility] [address], [place], [place]  [place] Lab Director: [id number], Phone:  [phone]

## 2022-02-10 ENCOUNTER — Other Ambulatory Visit: Payer: Self-pay | Admitting: Urology

## 2022-02-15 LAB — LITHOLINK 24HR URINE PANEL
Ammonium, Urine: 17 mmol/24 hr (ref 15–60)
Calcium Oxalate Saturation: 7.37 (ref 6.00–10.00)
Calcium Phosphate Saturation: 2.31 — ABNORMAL HIGH (ref 0.50–2.00)
Calcium, Urine: 190 mg/24 hr (ref <200)
Calcium/Creatinine Ratio: 223 mg/g creat (ref 51–262)
Calcium/Kg Body Weight: 2.1 mg/24 hr/kg (ref <4.0)
Chloride, Urine: 162 mmol/24 hr (ref 70–250)
Citrate, Urine: 571 mg/24 hr (ref >550)
Creatinine, Urine: 852 mg/24 hr
Creatinine/Kg Body Weight: 9.4 mg/24 hr/kg (ref 8.7–20.3)
Cystine, Urine, Qualitative: NEGATIVE
Magnesium, Urine: 34 mg/24 hr (ref 30–120)
Oxalate, Urine: 21 mg/24 hr (ref 20–40)
Phosphorus, Urine: 556 mg/24 hr — ABNORMAL LOW (ref 600–1200)
Potassium, Urine: 32 mmol/24 hr (ref 20–100)
Protein Catabolic Rate: 0.5 g/kg/24 hr — ABNORMAL LOW (ref 0.8–1.4)
Sodium, Urine: 167 mmol/24 hr — ABNORMAL HIGH (ref 50–150)
Sulfate, Urine: 16 meq/24 hr — ABNORMAL LOW (ref 20–80)
Urea Nitrogen, Urine: 4.82 g/24 hr — ABNORMAL LOW (ref 6.00–14.00)
Uric Acid Saturation: 0.25 (ref <1.00)
Uric Acid, Urine: 448 mg/24 hr (ref <750)
Urine Volume (Preserved): 1230 mL/24 hr (ref 500–4000)
pH, 24 hr, Urine: 6.526 — ABNORMAL HIGH (ref 5.800–6.200)

## 2022-02-20 ENCOUNTER — Encounter: Payer: Self-pay | Admitting: Urology

## 2023-12-08 ENCOUNTER — Emergency Department (HOSPITAL_COMMUNITY)
Admission: EM | Admit: 2023-12-08 | Discharge: 2023-12-08 | Disposition: A | Discharge status: Home / Self Care | Attending: Emergency Medicine | Admitting: Emergency Medicine

## 2023-12-08 ENCOUNTER — Other Ambulatory Visit: Payer: Self-pay

## 2023-12-08 ENCOUNTER — Encounter (HOSPITAL_COMMUNITY): Payer: Self-pay

## 2023-12-08 ENCOUNTER — Emergency Department (HOSPITAL_COMMUNITY)

## 2023-12-08 DIAGNOSIS — Z7982 Long term (current) use of aspirin: Secondary | ICD-10-CM | POA: Diagnosis not present

## 2023-12-08 DIAGNOSIS — R1032 Left lower quadrant pain: Secondary | ICD-10-CM | POA: Insufficient documentation

## 2023-12-08 LAB — CBC WITH DIFFERENTIAL/PLATELET
Abs Immature Granulocytes: 0.02 K/uL (ref 0.00–0.07)
Basophils Absolute: 0 K/uL (ref 0.0–0.1)
Basophils Relative: 0 %
Eosinophils Absolute: 0.1 K/uL (ref 0.0–0.5)
Eosinophils Relative: 2 %
HCT: 40.6 % (ref 36.0–46.0)
Hemoglobin: 12.7 g/dL (ref 12.0–15.0)
Immature Granulocytes: 0 %
Lymphocytes Relative: 30 %
Lymphs Abs: 1.9 K/uL (ref 0.7–4.0)
MCH: 26.2 pg (ref 26.0–34.0)
MCHC: 31.3 g/dL (ref 30.0–36.0)
MCV: 83.9 fL (ref 80.0–100.0)
Monocytes Absolute: 0.5 K/uL (ref 0.1–1.0)
Monocytes Relative: 7 %
Neutro Abs: 3.7 K/uL (ref 1.7–7.7)
Neutrophils Relative %: 61 %
Platelets: 208 K/uL (ref 150–400)
RBC: 4.84 MIL/uL (ref 3.87–5.11)
RDW: 14.1 % (ref 11.5–15.5)
WBC: 6.2 K/uL (ref 4.0–10.5)
nRBC: 0 % (ref 0.0–0.2)

## 2023-12-08 LAB — COMPREHENSIVE METABOLIC PANEL WITH GFR
ALT: 17 U/L (ref 0–44)
AST: 18 U/L (ref 15–41)
Albumin: 3.8 g/dL (ref 3.5–5.0)
Alkaline Phosphatase: 87 U/L (ref 38–126)
Anion gap: 7 (ref 5–15)
BUN: 14 mg/dL (ref 8–23)
CO2: 24 mmol/L (ref 22–32)
Calcium: 10.8 mg/dL — ABNORMAL HIGH (ref 8.9–10.3)
Chloride: 105 mmol/L (ref 98–111)
Creatinine, Ser: 0.93 mg/dL (ref 0.44–1.00)
GFR, Estimated: >60 mL/min (ref >60)
Glucose, Bld: 100 mg/dL — ABNORMAL HIGH (ref 70–99)
Potassium: 4.6 mmol/L (ref 3.5–5.1)
Sodium: 136 mmol/L (ref 135–145)
Total Bilirubin: 0.5 mg/dL (ref 0.0–1.2)
Total Protein: 7.5 g/dL (ref 6.5–8.1)

## 2023-12-08 LAB — URINALYSIS, ROUTINE W REFLEX MICROSCOPIC
Bilirubin Urine: NEGATIVE
Glucose, UA: NEGATIVE mg/dL
Hgb urine dipstick: NEGATIVE
Ketones, ur: NEGATIVE mg/dL
Leukocytes,Ua: NEGATIVE
Nitrite: NEGATIVE
Protein, ur: NEGATIVE mg/dL
Specific Gravity, Urine: 1.016 (ref 1.005–1.030)
pH: 6 (ref 5.0–8.0)

## 2023-12-08 LAB — LIPASE, BLOOD: Lipase: 26 U/L (ref 11–51)

## 2023-12-08 MED ORDER — IOHEXOL 350 MG/ML SOLN
75.0000 mL | Freq: Once | INTRAVENOUS | Status: AC | PRN
  Administered 2023-12-08: 75 mL via INTRAVENOUS

## 2023-12-08 NOTE — Discharge Instructions (Signed)
 Please follow-up closely with your primary care doctor on an outpatient basis.  Return to emergency department immediately for any new or worsening symptoms.

## 2023-12-08 NOTE — ED Provider Notes (Signed)
 [place] EMERGENCY DEPARTMENT AT [facility] Provider Note   CSN: [id number] Arrival date & time: 12/08/23  1134     Patient presents with: Abdominal Pain   Yvonne Mahoney is a 78 y.o. female.   Patient is an 78 year old female who presents emergency department with a chief complaint of left lower quadrant abdominal pain which has been ongoing for approximate the past week and a half.  She notes that the pain is worse at night with lying flat.  She notes ambulating does help the pain.  She notes she cannot reproduce it with palpation of the abdomen.  She denies any associated fever, chills, chest pain, shortness of breath, nausea, vomiting, diarrhea.  She denies any recent falls or blunt abdominal wall trauma.  She does note that she is status post partial hysterectomy as well as cholecystectomy.  She does have a history of kidney stones.  She denies any dysuria or hematuria.  She has had no constipation.   Abdominal Pain      Prior to Admission medications   Medication Sig Start Date End Date Taking? Authorizing Provider  acetaminophen  (TYLENOL ) 650 MG CR tablet Take 650 mg by mouth every 8 (eight) hours as needed for pain.    [provider]  amLODipine  (NORVASC ) 10 MG tablet Take 1 tablet (10 mg total) by mouth daily. 09/17/14   Lenon Juliane RAMAN, MD  aspirin 325 MG tablet Take 325 mg by mouth daily as needed for moderate pain. Rarely takes ASA.    [provider]  meloxicam  (MOBIC ) 7.5 MG tablet Take 1 tablet (7.5 mg total) by mouth daily. Patient not taking: Reported on 01/26/2022 11/27/21   Pollina, Yissel Habermehl J, MD  metoprolol  succinate (TOPROL -XL) 100 MG 24 hr tablet Take 100 mg by mouth daily. 07/28/19   [provider]  ondansetron  (ZOFRAN  ODT) 4 MG disintegrating tablet Take 1 tablet (4 mg total) by mouth every 8 (eight) hours as needed for nausea or vomiting. Patient not taking: Reported on 01/26/2022 03/01/21   Dean Clarity, MD   predniSONE  (STERAPRED UNI-PAK 21 TAB) 10 MG (21) TBPK tablet Take by mouth daily. Take 6 tabs by mouth daily  for 2 days, then 5 tabs for 2 days, then 4 tabs for 2 days, then 3 tabs for 2 days, 2 tabs for 2 days, then 1 tab by mouth daily for 2 days Patient not taking: Reported on 01/26/2022 03/01/21   Dean Clarity, MD  tamsulosin  (FLOMAX ) 0.4 MG CAPS capsule Take 1 capsule (0.4 mg total) by mouth daily. Patient not taking: Reported on 01/26/2022 03/01/21   Haviland, Julie, MD  traMADol  (ULTRAM ) 50 MG tablet Take 1 tablet (50 mg total) by mouth every 6 (six) hours as needed. 01/16/22   Idol, Julie, PA-C    Allergies: Methocarbamol , Naproxen , Oxycodone , and Penicillins    Review of Systems  Gastrointestinal:  Positive for abdominal pain.  All other systems reviewed and are negative.   Updated Vital Signs BP 125/63 (BP Location: Right Arm)   Pulse 74   Temp 98 F (36.7 C) (Oral)   Resp 18   Ht 5' 4 (1.626 m)   Wt 85.7 kg   SpO2 96%   BMI 32.44 kg/m   Physical Exam Vitals and nursing note reviewed.  Constitutional:      Appearance: Normal appearance.  HENT:     Head: Normocephalic and atraumatic.     Nose: Nose normal.     Mouth/Throat:  Mouth: Mucous membranes are moist.  Eyes:     Extraocular Movements: Extraocular movements intact.     Conjunctiva/sclera: Conjunctivae normal.     Pupils: Pupils are equal, round, and reactive to light.  Cardiovascular:     Rate and Rhythm: Normal rate and regular rhythm.     Pulses: Normal pulses.     Heart sounds: Normal heart sounds. No murmur heard.    No gallop.  Pulmonary:     Effort: Pulmonary effort is normal. No respiratory distress.     Breath sounds: Normal breath sounds. No stridor. No wheezing, rhonchi or rales.  Abdominal:     General: Abdomen is flat. Bowel sounds are normal. There is no distension. There are no signs of injury.     Palpations: Abdomen is soft. There is no mass.     Tenderness: There is no abdominal  tenderness. There is no guarding. Negative signs include Murphy's sign and McBurney's sign.     Hernia: No hernia is present.  Musculoskeletal:        General: Normal range of motion.     Cervical back: Normal range of motion and neck supple.  Skin:    General: Skin is warm and dry.  Neurological:     General: No focal deficit present.     Mental Status: She is alert and oriented to person, place, and time. Mental status is at baseline.  Psychiatric:        Mood and Affect: Mood normal.        Behavior: Behavior normal.        Thought Content: Thought content normal.        Judgment: Judgment normal.     (all labs ordered are listed, but only abnormal results are displayed) Labs Reviewed  LIPASE, BLOOD  COMPREHENSIVE METABOLIC PANEL WITH GFR  URINALYSIS, ROUTINE W REFLEX MICROSCOPIC  CBC WITH DIFFERENTIAL/PLATELET    EKG: None  Radiology: No results found.   Procedures   Medications Ordered in the ED - No data to display                                  Medical Decision Making Amount and/or Complexity of Data Reviewed Labs: ordered. Radiology: ordered.  Risk Prescription drug management.   This patient presents to the ED for concern of left lower quadrant abdominal pain differential diagnosis includes acute appendicitis, cholecystitis, bowel obstruction, diverticulitis, ovarian torsion cyst, PID, tubing abscess, pyelonephritis, kidney stone, pancreatitis, mesenteric ischemia    Additional history obtained:  Additional history obtained from none External records from outside source obtained and reviewed including none   Lab Tests:  I Ordered, and personally interpreted labs.  The pertinent results include: No leukocytosis, no anemia, normal kidney function liver function, normal electrolytes, unremarkable urinalysis, negative lipase   Imaging Studies ordered:  I ordered imaging studies including CT abdomen and pelvis I independently visualized and  interpreted imaging which showed no acute intra-abdominal surgical process I agree with the radiologist interpretation   Problem List / ED Course:  Patient is doing well at this time and is stable for discharge home.  Discussed with patient that CT scan of the abdomen pelvis was overall unremarkable.  She may have passed a kidney stone as there was a calcified area within the bladder.  Abdominal exam is benign at this point with no focal tenderness throughout.  Blood work has otherwise been unremarkable.  Vital signs  are stable with no indication for sepsis.  Do not suspect any further emergent workup is warranted and do not suspect that admission is warranted at this time.  Close follow-up with PCP was discussed as well as strict turn precautions for any new or worsening symptoms.  Patient voiced understanding and had no additional questions.  Patient was fully evaluated by attending physician who is in agreement to plan at this time.   Social Determinants of Health:  None        Final diagnoses:  None    ED Discharge Orders     None          Daralene Lonni JONETTA DEVONNA 12/08/23 1513    Garrick Charleston, MD 12/09/23 1009

## 2023-12-08 NOTE — ED Triage Notes (Signed)
 Pt arrived via POV c/o left side abdominal pain. Pt reports Hx of kidney stones. Pt reports pain is worse at night and in the morning. Pt reports pain is worse after eating watermelon.
